# Patient Record
Sex: Male | Born: 1960 | Race: White | Hispanic: No | Marital: Married | Smoking: Never smoker
Health system: Southern US, Community
[De-identification: ages and names within clinical notes are randomized; demographics above are authoritative.]

## PROBLEM LIST (undated history)

## (undated) DIAGNOSIS — E669 Obesity, unspecified: Secondary | ICD-10-CM

## (undated) DIAGNOSIS — T7840XA Allergy, unspecified, initial encounter: Secondary | ICD-10-CM

## (undated) DIAGNOSIS — I10 Essential (primary) hypertension: Secondary | ICD-10-CM

## (undated) DIAGNOSIS — E785 Hyperlipidemia, unspecified: Secondary | ICD-10-CM

## (undated) DIAGNOSIS — Z86010 Personal history of colonic polyps: Secondary | ICD-10-CM

## (undated) DIAGNOSIS — M1990 Unspecified osteoarthritis, unspecified site: Secondary | ICD-10-CM

## (undated) DIAGNOSIS — F419 Anxiety disorder, unspecified: Secondary | ICD-10-CM

## (undated) DIAGNOSIS — D229 Melanocytic nevi, unspecified: Secondary | ICD-10-CM

## (undated) DIAGNOSIS — K219 Gastro-esophageal reflux disease without esophagitis: Secondary | ICD-10-CM

## (undated) DIAGNOSIS — I452 Bifascicular block: Secondary | ICD-10-CM

## (undated) HISTORY — DX: Anxiety disorder, unspecified: F41.9

## (undated) HISTORY — PX: LASIK: SHX215

## (undated) HISTORY — DX: Unspecified osteoarthritis, unspecified site: M19.90

## (undated) HISTORY — DX: Obesity, unspecified: E66.9

## (undated) HISTORY — DX: Gastro-esophageal reflux disease without esophagitis: K21.9

## (undated) HISTORY — PX: LIPOMA EXCISION: SHX5283

## (undated) HISTORY — DX: Personal history of colonic polyps: Z86.010

## (undated) HISTORY — DX: Hyperlipidemia, unspecified: E78.5

## (undated) HISTORY — PX: COLONOSCOPY: SHX174

## (undated) HISTORY — DX: Bifascicular block: I45.2

## (undated) HISTORY — DX: Essential (primary) hypertension: I10

## (undated) HISTORY — DX: Allergy, unspecified, initial encounter: T78.40XA

## (undated) HISTORY — DX: Melanocytic nevi, unspecified: D22.9

---

## 2004-11-10 ENCOUNTER — Ambulatory Visit: Payer: Self-pay | Admitting: Internal Medicine

## 2005-02-09 ENCOUNTER — Ambulatory Visit: Payer: Self-pay | Admitting: Internal Medicine

## 2005-08-24 ENCOUNTER — Ambulatory Visit: Payer: Self-pay | Admitting: Internal Medicine

## 2007-04-21 ENCOUNTER — Telehealth (INDEPENDENT_AMBULATORY_CARE_PROVIDER_SITE_OTHER): Payer: Self-pay | Admitting: *Deleted

## 2007-04-23 ENCOUNTER — Encounter: Payer: Self-pay | Admitting: Internal Medicine

## 2007-04-23 DIAGNOSIS — D239 Other benign neoplasm of skin, unspecified: Secondary | ICD-10-CM

## 2007-04-23 DIAGNOSIS — J309 Allergic rhinitis, unspecified: Secondary | ICD-10-CM

## 2007-04-23 HISTORY — DX: Other benign neoplasm of skin, unspecified: D23.9

## 2009-09-28 ENCOUNTER — Ambulatory Visit: Payer: Self-pay | Admitting: Internal Medicine

## 2009-09-28 DIAGNOSIS — R05 Cough: Secondary | ICD-10-CM

## 2009-09-28 DIAGNOSIS — F988 Other specified behavioral and emotional disorders with onset usually occurring in childhood and adolescence: Secondary | ICD-10-CM | POA: Insufficient documentation

## 2009-09-29 LAB — CONVERTED CEMR LAB
Basophils Absolute: 0 10*3/uL (ref 0.0–0.1)
Basophils Relative: 0.3 % (ref 0.0–3.0)
Calcium: 10.1 mg/dL (ref 8.4–10.5)
Chloride: 106 meq/L (ref 96–112)
Creatinine, Ser: 0.9 mg/dL (ref 0.4–1.5)
Eosinophils Absolute: 0.1 10*3/uL (ref 0.0–0.7)
Lymphs Abs: 1.5 10*3/uL (ref 0.7–4.0)
MCHC: 34.3 g/dL (ref 30.0–36.0)
MCV: 87.8 fL (ref 78.0–100.0)
Platelets: 268 10*3/uL (ref 150.0–400.0)
Potassium: 5.3 meq/L — ABNORMAL HIGH (ref 3.5–5.1)

## 2009-10-05 ENCOUNTER — Telehealth: Payer: Self-pay | Admitting: Internal Medicine

## 2009-10-05 LAB — CONVERTED CEMR LAB: IgE (Immunoglobulin E), Serum: 7.7 intl units/mL (ref 0.0–180.0)

## 2010-05-16 NOTE — Assessment & Plan Note (Signed)
Summary: Primary svc/ new pt eval for cough   Primary Provider/Referring Provider:  Sandrea Hughs, MD  CC:  Acute visit.  Pt last seen here in 2006.  He c/o dry cough x 4 wks.  He also would like to discuss ADHD.  He has had some issues with concentration.  He brought in ADHD test with him today.Marland Kitchen  History of Present Illness: 25 yowm never smoker with h/o chronic intermittent rhinitis not seasonal.  September 28, 2009 cough x 1 month assoc with mild sob day > night, dry, no real increase in sinus symptoms, previously responded to allegra but did not know he could get this otc.  No purulent secretions. Still aerobically active.  Pt denies any significant sore throat, dysphagia, itching, sneezing,  nasal congestion or excess secretions,  fever, chills, sweats, unintended wt loss, pleuritic or exertional cp, hempoptysis, change in activity tolerance  orthopnea pnd or leg swelling. Pt also denies any obvious fluctuation in symptoms with weather or environmental change or other alleviating or aggravating factors.       Current Medications (verified): 1)  Adult Aspirin Ec Low Strength 81 Mg  Tbec (Aspirin) 2)  Multivitamins  Tabs (Multiple Vitamin) .Marland Kitchen.. 1 Once Daily 3)  Ibuprofen 200 Mg Tabs (Ibuprofen) .... 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  The patient denies shortness of breath with activity, productive cough, coughing up blood,  chest pain, irregular heartbeats, acid heartburn, indigestion, loss of appetite, weight change, abdominal pain, difficulty swallowing, sore throat, tooth/dental problems, headaches, nasal congestion/difficulty breathing through nose, sneezing, itching, ear ache, depression, hand/feet swelling, joint stiffness or pain, rash, change in color of mucus, and fever.    Vital Signs:  Patient profile:   50 year old male Height:      70 inches Weight:      191 pounds BMI:     27.50 O2 Sat:      99 % on Room air Temp:     98.8 degrees F oral Pulse rate:   63 / minute BP sitting:   110 / 68  (left arm)  Vitals Entered By: Vernie Murders (September 28, 2009 10:40 AM)  O2 Flow:  Room air  Physical Exam  Additional Exam:  wt 181 > 191 September 28, 2009 HEENT: nl dentition, turbinates, and orophanx. Nl external ear canals without cough reflex NECK :  without JVD/Nodes/TM/ nl carotid upstrokes bilaterally LUNGS: no acc muscle use, clear to A and P bilaterally without cough on insp or exp maneuvers CV:  RRR  no s3 or murmur or increase in P2, no edema  ABD:  soft and nontender with nl excursion in the supine position. No bruits or organomegaly, bowel sounds nl MS:  warm without deformities, calf tenderness, cyanosis or clubbing SKIN: warm and dry without lesions   NEURO:  alert, approp, no deficits      Sodium  142 mEq/L                   135-145   Potassium            [H]  5.3 mEq/L                   3.5-5.1   Chloride                  106 mEq/L                   96-112   Carbon Dioxide            31 mEq/L                    19-32   Glucose              [H]  100 mg/dL                   47-82   BUN                       10 mg/dL                    9-56   Creatinine                0.9 mg/dL                   2.1-3.0   Calcium                   10.1 mg/dL                  8.6-57.8   GFR                       94.75 mL/min                >60  Tests: (2) CBC Platelet w/Diff (CBCD)   White  Cell Count          6.4 K/uL                    4.5-10.5   Red Cell Count            4.78 Mil/uL                 4.22-5.81   Hemoglobin                14.4 g/dL                   46.9-62.9   Hematocrit                42.0 %                      39.0-52.0   MCV                       87.8 fl                     78.0-100.0   MCHC                      34.3 g/dL                   52.8-41.0  RDW                       12.5 %                      11.5-14.6   Platelet Count            268.0 K/uL                  150.0-400.0   Neutrophil %              64.0 %                      43.0-77.0   Lymphocyte %              24.1 %                      12.0-46.0   Monocyte %                10.6 %                      3.0-12.0   Eosinophils%              1.0 %                       0.0-5.0   Basophils %               0.3 %                       0.0-3.0   Neutrophill Absolute      4.1 K/uL                    1.4-7.7   Lymphocyte Absolute       1.5 K/uL                    0.7-4.0   Monocyte Absolute         0.7 K/uL                    0.1-1.0  Eosinophils, Absolute                             0.1 K/uL                    0.0-0.7   Basophils Absolute        0.0 K/uL                    0.0-0.1  CXR  Procedure date:  09/28/2009  Findings:      Comparison: 11/10/2004   Findings: Heart size is upper normal.  Negative for heart failure. Lungs are clear without infiltrate or effusion.  Negative for mass lesion.   IMPRESSION: No acute radiographic abnormality.  Impression & Recommendations:  Problem # 1:  COUGH (ICD-786.2) The most common causes of chronic cough in immunocompetent adults include: upper airway cough syndrome (UACS), previously referred to as postnasal drip syndrome,  caused by variety of rhinosinus conditions; (2) asthma; (3) GERD; (4) chronic bronchitis from cigarette smoking or other inhaled environmental irritants; (5) nonasthmatic eosinophilic bronchitis; and (6) bronchiectasis. These  conditions, singly or in combination, have accounted for up to 94% of the  causes of chronic cough in prospective studies.  this fits best with  Classic Upper airway cough syndrome, so named because it's frequently impossible to sort out how much is  CR/sinusitis with freq throat clearing (which can be related to primary GERD)   vs  causing  secondary extra esophageal GERD from wide swings in gastric pressure that occur with throat clearing, promoting self use of mint and menthol lozenges that reduce the lower esophageal sphincter tone and exacerbate the problem further These are the same pts who not infrequently have failed to tolerate ace inhibitors,  dry powder inhalers or biphosphonates or report having reflux symptoms that don't respond to standard doses of PPI  See instructions for specific recommendations   Problem # 2:  ATTENTION DEFICIT DISORDER, 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 1 once daily 2)  Ibuprofen 200 Mg Tabs (Ibuprofen) .... As directed as needed  Other Orders: T-2 View CXR (71020TC)  Patient Instructions: 1)  See Patient Care Coordinator before leaving for psychiatric referral for possible Adult ADD 2)  Try clairiton otc for cough and if ineffective try [place]rim[place] 3)  Please schedule a follow-up appointment in 6 weeks, sooner if needed    CXR  Procedure date:  09/28/2009  Findings:      Comparison: 11/10/2004   Findings: Heart size is upper normal.  Negative for heart failure. Lungs are clear without infiltrate or effusion.  Negative for mass lesion.   IMPRESSION: No acute radiographic abnormality.

## 2010-05-16 NOTE — Progress Notes (Signed)
Summary: returning call  Phone Note Call from Patient Call back at Work Phone [phone]   Caller: Patient Call For: Chelsey Redondo Summary of Call: Returning Leslie's call. Initial call taken by: Darletta Moll,  October 05, 2009 8:19 AM  Follow-up for Phone Call        pt returned call to leslie----spoke with pt and he is aware of his allergy panel was normal.  pt requested copy mailed to him Randell Loop CMA  October 05, 2009 8:49 AM

## 2010-08-21 ENCOUNTER — Ambulatory Visit (INDEPENDENT_AMBULATORY_CARE_PROVIDER_SITE_OTHER): Payer: BC Managed Care – PPO | Admitting: Adult Health

## 2010-08-21 ENCOUNTER — Encounter: Payer: Self-pay | Admitting: Adult Health

## 2010-08-21 DIAGNOSIS — J209 Acute bronchitis, unspecified: Secondary | ICD-10-CM

## 2010-08-21 MED ORDER — CEFDINIR 300 MG PO CAPS: 300.0000 mg | ORAL_CAPSULE | Freq: Two times a day (BID) | ORAL | Status: AC

## 2010-08-21 MED ORDER — HYDROCODONE-HOMATROPINE 5-1.5 MG/5ML PO SYRP: 5.0000 mL | ORAL_SOLUTION | Freq: Four times a day (QID) | ORAL | Status: AC | PRN

## 2010-08-21 NOTE — Patient Instructions (Addendum)
Omnicef 300mg  Twice daily  For 7 days take with food  Mucinex DM Twice daily   Fluids and rest  Hydromet 1-2 tsp every 4-6 hr As needed  For cough  Please contact office for sooner follow up if symptoms do not improve or worsen or seek emergency care

## 2010-08-21 NOTE — Progress Notes (Signed)
  Subjective:    Patient ID: Louis Lamb, male    DOB: 1960/04/18, 50 y.o.   MRN: [id number]  HPI 08/21/10 Acute OV  Pt presents for work in visit. Complains of prod cough with yellow / brown mucus, increased respirations, lightheadedness x1week. Has drainage down throat. OTC not working. Cough is getting worse. No chest pain or hemoptysis.  Cough is keeping him up at night. Has some nasal stuffiness and ear fullness. No sinus pressure.   Review of Systems Constitutional:   No  weight loss, night sweats,  Fevers, chills, .  HEENT:   No headaches,  Difficulty swallowing,  Tooth/dental problems, or  Sore throat,             +sneezing, itching, ear fullness , nasal congestion, post nasal drip,   CV:  No chest pain,  Orthopnea, PND, swelling in lower extremities, anasarca, dizziness, palpitations, syncope.   GI  No heartburn, indigestion, abdominal pain, nausea, vomiting, diarrhea, change in bowel habits, loss of appetite, bloody stools.   Resp: No shortness of breath with exertion or at rest.  + excess mucus, no productive cough, No wheezing.  No chest wall deformity  Skin: no rash or lesions.  GU: no dysuria, change in color of urine, no urgency or frequency.  No flank pain, no hematuria   MS:  No joint pain or swelling.  No decreased range of motion.  No back pain.  Psych:  No change in mood or affect. No depression or anxiety.  No memory loss.          Objective:   Physical Exam GEN: A/Ox3; pleasant , NAD, well nourished   HEENT:  [place]/AT,  EACs-clear, TMs-wnl, NOSE-clear drainage  THROAT-clear, no lesions, no postnasal drip or exudate noted.   NECK:  Supple w/ fair ROM; no JVD; normal carotid impulses w/o bruits; no thyromegaly or nodules palpated; no lymphadenopathy.  RESP  Coarse BS  P & A; w/o, wheezes/ rales/ or rhonchi.no accessory muscle use, no dullness to percussion  CARD:  RRR, no m/r/g  , no peripheral edema, pulses intact, no cyanosis or clubbing.  GI:    Soft & nt; nml bowel sounds; no organomegaly or masses detected.  Musco: Warm bil, no deformities or joint swelling noted.   Neuro: alert, no focal deficits noted.    Skin: Warm, no lesions or rashes         Assessment & Plan:

## 2010-08-27 DIAGNOSIS — J209 Acute bronchitis, unspecified: Secondary | ICD-10-CM | POA: Insufficient documentation

## 2010-08-27 NOTE — Assessment & Plan Note (Signed)
Omnicef 300mg Twice daily  For 7 days take with food  Mucinex DM Twice daily   Fluids and rest  Hydromet 1-2 tsp every 4-6 hr As needed  For cough  Please contact office for sooner follow up if symptoms do not improve or worsen or seek emergency care   

## 2011-07-20 ENCOUNTER — Encounter: Payer: Self-pay | Admitting: Adult Health

## 2011-07-20 ENCOUNTER — Ambulatory Visit (INDEPENDENT_AMBULATORY_CARE_PROVIDER_SITE_OTHER): Payer: BC Managed Care – PPO | Admitting: Adult Health

## 2011-07-20 ENCOUNTER — Other Ambulatory Visit (INDEPENDENT_AMBULATORY_CARE_PROVIDER_SITE_OTHER): Payer: BC Managed Care – PPO

## 2011-07-20 VITALS — BP 134/86 | HR 67 | Temp 97.6°F | Ht 72.0 in | Wt 192.6 lb

## 2011-07-20 DIAGNOSIS — I10 Essential (primary) hypertension: Secondary | ICD-10-CM

## 2011-07-20 DIAGNOSIS — R030 Elevated blood-pressure reading, without diagnosis of hypertension: Secondary | ICD-10-CM

## 2011-07-20 LAB — BASIC METABOLIC PANEL
BUN: 15 mg/dL (ref 6–23)
CO2: 29 mEq/L (ref 19–32)
Chloride: 101 mEq/L (ref 96–112)
GFR: 84.34 mL/min (ref >60.00)
Glucose, Bld: 108 mg/dL — ABNORMAL HIGH (ref 70–99)
Potassium: 4.3 mEq/L (ref 3.5–5.1)

## 2011-07-20 NOTE — Progress Notes (Signed)
  Subjective:    Patient ID: Louis Lamb, male    DOB: Dec 29, 1960, 51 y.o.   MRN: [id number]  HPI 51 yowm never smoker with h/o chronic intermittent rhinitis  07/20/11 Acute OV  Has noticed elevated blood pressure  readings up to 145/90 on 07-19-11. No associated headache or chest pain. No visual/speech changes. Takes an occasional advil .  No new meds.    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  HEENT:   No headaches,  Difficulty swallowing,  Tooth/dental problems, or  Sore throat,                No sneezing, itching, ear ache, nasal congestion, post nasal drip,   CV:  No chest pain,  Orthopnea, PND, swelling in lower extremities, anasarca, dizziness, palpitations, syncope.   GI  No heartburn, indigestion, abdominal pain, nausea, vomiting, diarrhea, change in bowel habits, loss of appetite, bloody stools.   Resp: No shortness of breath with exertion or at rest.  No excess mucus, no productive cough,  No non-productive cough,  No coughing up of blood.  No change in color of mucus.  No wheezing.  No chest wall deformity  Skin: no rash or lesions.  GU: no dysuria, change in color of urine, no urgency or frequency.  No flank pain, no hematuria   MS:  No joint pain or swelling.  No decreased range of motion.  No back pain.  Psych:  No change in mood or affect. No depression or anxiety.  No memory loss.         Objective:   Physical Exam GEN: A/Ox3; pleasant , NAD, well nourished   HEENT:  [place]/AT,   EACs-clear, TMs-wnl, NOSE-clear, THROAT-clear, no lesions, no postnasal drip or exudate noted.   NECK:  Supple w/ fair ROM; no JVD; normal carotid impulses w/o bruits; no thyromegaly or nodules palpated; no lymphadenopathy.  RESP  Clear  P & A; w/o, wheezes/ rales/ or rhonchi.no accessory muscle use, no dullness to percussion  CARD:  RRR, no m/r/g  , no peripheral edema, pulses intact, no cyanosis or clubbing.  GI:   Soft & nt; nml bowel sounds; no organomegaly or masses detected.  Musco: Warm bil, no deformities or joint swelling noted.   Neuro: alert, no focal deficits noted.    Skin: Warm, no lesions or rashes         Assessment & Plan:

## 2011-07-20 NOTE — Patient Instructions (Signed)
Healthy lifestyle changes that we discussed.  Check blood pressure 3 times weekly , keep log, call if blood pressure >160 . -bring on return office visit.  Low salt diet.  Exercise , weight loss.  I will call with labs work  Avoid Decongestants- sudafed like meds and NSAIDS -advil like meds  Follow up Dr. Sherene Sires  In 4 weeks for physical and As needed  -come fasting

## 2011-07-23 DIAGNOSIS — I10 Essential (primary) hypertension: Secondary | ICD-10-CM | POA: Insufficient documentation

## 2011-07-23 NOTE — Assessment & Plan Note (Addendum)
Check bmet   Plan:  Healthy lifestyle changes that we discussed.  Check blood pressure 3 times weekly , keep log, call if blood pressure >160 . -bring on return office visit.  Low salt diet.  Exercise , weight loss.  I will call with labs work  Avoid Decongestants- sudafed like meds and NSAIDS -advil like meds  Follow up Dr. Sherene Sires  In 4 weeks for physical and As needed  -come fasting

## 2011-07-26 NOTE — Progress Notes (Signed)
Quick Note:  Called, spoke with pt. I informed him labs look ok per TP, and advised he should follow up as planned with Dr. Sherene Sires for cpx. He verbalized understanding of these results and recs and voiced no further questions/concerns at this time. ______

## 2011-08-17 ENCOUNTER — Encounter: Payer: Self-pay | Admitting: Internal Medicine

## 2011-08-17 ENCOUNTER — Other Ambulatory Visit (INDEPENDENT_AMBULATORY_CARE_PROVIDER_SITE_OTHER): Payer: BC Managed Care – PPO

## 2011-08-17 ENCOUNTER — Ambulatory Visit (INDEPENDENT_AMBULATORY_CARE_PROVIDER_SITE_OTHER): Payer: BC Managed Care – PPO | Admitting: Internal Medicine

## 2011-08-17 ENCOUNTER — Ambulatory Visit (INDEPENDENT_AMBULATORY_CARE_PROVIDER_SITE_OTHER)
Admission: RE | Admit: 2011-08-17 | Discharge: 2011-08-17 | Disposition: A | Discharge status: Home / Self Care | Payer: BC Managed Care – PPO | Source: Ambulatory Visit | Attending: Internal Medicine | Admitting: Internal Medicine

## 2011-08-17 VITALS — BP 132/90 | HR 62 | Temp 99.5°F | Ht 72.0 in | Wt 189.0 lb

## 2011-08-17 DIAGNOSIS — Z0000 Encounter for general adult medical examination without abnormal findings: Secondary | ICD-10-CM

## 2011-08-17 DIAGNOSIS — E785 Hyperlipidemia, unspecified: Secondary | ICD-10-CM

## 2011-08-17 DIAGNOSIS — R030 Elevated blood-pressure reading, without diagnosis of hypertension: Secondary | ICD-10-CM

## 2011-08-17 DIAGNOSIS — J309 Allergic rhinitis, unspecified: Secondary | ICD-10-CM

## 2011-08-17 DIAGNOSIS — I10 Essential (primary) hypertension: Secondary | ICD-10-CM

## 2011-08-17 LAB — HEPATIC FUNCTION PANEL
ALT: 27 U/L (ref 0–53)
AST: 28 U/L (ref 0–37)
Alkaline Phosphatase: 65 U/L (ref 39–117)
Bilirubin, Direct: 0 mg/dL (ref 0.0–0.3)
Total Protein: 7.5 g/dL (ref 6.0–8.3)

## 2011-08-17 LAB — BASIC METABOLIC PANEL
BUN: 15 mg/dL (ref 6–23)
Creatinine, Ser: 0.9 mg/dL (ref 0.4–1.5)
GFR: 99.01 mL/min (ref >60.00)
Glucose, Bld: 88 mg/dL (ref 70–99)

## 2011-08-17 LAB — LIPID PANEL
Total CHOL/HDL Ratio: 4
Triglycerides: 253 mg/dL — ABNORMAL HIGH (ref 0.0–149.0)

## 2011-08-17 LAB — URINALYSIS
Bilirubin Urine: NEGATIVE
Ketones, ur: NEGATIVE
Leukocytes, UA: NEGATIVE
Nitrite: NEGATIVE
Specific Gravity, Urine: 1.01 (ref 1.000–1.030)
Urobilinogen, UA: 0.2 (ref 0.0–1.0)
pH: 7 (ref 5.0–8.0)

## 2011-08-17 LAB — CBC WITH DIFFERENTIAL/PLATELET
Basophils Relative: 0.6 % (ref 0.0–3.0)
Eosinophils Relative: 1.4 % (ref 0.0–5.0)
Lymphocytes Relative: 24.9 % (ref 12.0–46.0)
MCV: 86.4 fl (ref 78.0–100.0)
Monocytes Relative: 10.1 % (ref 3.0–12.0)
Neutrophils Relative %: 63 % (ref 43.0–77.0)
RBC: 5.18 Mil/uL (ref 4.22–5.81)
WBC: 6.4 10*3/uL (ref 4.5–10.5)

## 2011-08-17 LAB — TSH: TSH: 1.57 u[IU]/mL (ref 0.35–5.50)

## 2011-08-17 NOTE — Patient Instructions (Signed)
Weight control is simply a matter of calorie balance which needs to be tilted in your favor by eating less and exercising more.  To get the most out of exercise, you need to be continuously aware that you are short of breath, but never out of breath, for 30 minutes (minimum of 3 x weeks) . As you improve, it will actually be easier for you to do the same amount of exercise  in  30 minutes so always push to the level where you are short of breath.  If this does not result in gradual weight reduction then I strongly recommend you see a nutritionist with a food diary x 2 weeks so that we can work out a negative calorie balance which is universally effective in steady weight loss programs.  Your goal is 135/85 with colonoscopy due next year.  Please remember to go to the x-ray department downstairs for your tests - we will call you with the results when they are available.

## 2011-08-17 NOTE — Progress Notes (Signed)
  Subjective:    Patient ID: Louis Lamb, male    DOB: 21-Feb-1961, 51 y.o.   MRN: [id number]  HPI 64 yowm never smoker with h/o chronic intermittent rhinitis and no knowledge of fm hx as adopted followed in pulmonary clinic for primary care.  07/20/11 Acute OV /Willys Salvino Has noticed elevated blood pressure  readings up to 145/90 on 07-19-11. No associated headache or chest pain. No visual/speech changes. Takes an occasional advil .  No new meds.   08/17/11 CPX cc self monitor bp on high side but none above 140/90, no tia or claudication or cp or sob/ ha.  ROS  At present neg for  any significant sore throat, dysphagia, dental problems, itching, sneezing,  nasal congestion or excess/ purulent secretions, ear ache,   fever, chills, sweats, unintended wt loss, pleuritic or exertional cp, hemoptysis, palpitations, orthopnea pnd or leg swelling.  Also denies presyncope, palpitations, heartburn, abdominal pain, anorexia, nausea, vomiting, diarrhea  or change in bowel or urinary habits, change in stools or urine, dysuria,hematuria,  rash, arthralgias, visual complaints, headache, numbness weakness or ataxia or problems with walking or coordination. No noted change in mood/affect or memory.                   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   NECK:  Supple w/  fair ROM; no JVD; normal carotid impulses w/o bruits; no thyromegaly or nodules palpated; no lymphadenopathy.  RESP  Clear  P & A; w/o, wheezes/ rales/ or rhonchi.no accessory muscle use, no dullness to percussion  CARD:  RRR, no m/r/g  , no peripheral edema, pulses intact, no cyanosis or clubbing.  GI:   Soft & nt; nml bowel sounds; no organomegaly or masses detected.  Musco: Warm bil, no deformities or joint swelling noted.   Neuro: alert, no focal deficits noted.    Skin: Warm, no lesions or rashes  GU Circ, no IH, no nodules  Prostate nl, no nodules, stool g neg  Neuro  Mood and affect and sensorium nl, no motor def or path reflexes, nl cerebellar testing.    CXR  08/17/2011 :  No active disease. No significant change.        Assessment & Plan:

## 2011-08-18 NOTE — Assessment & Plan Note (Signed)
Adequate control on present rx, reviewed  

## 2011-08-18 NOTE — Assessment & Plan Note (Signed)
Borderline and prob can be controlled with diet and ex, discussed.

## 2012-09-04 ENCOUNTER — Ambulatory Visit (INDEPENDENT_AMBULATORY_CARE_PROVIDER_SITE_OTHER)
Admission: RE | Admit: 2012-09-04 | Discharge: 2012-09-04 | Disposition: A | Discharge status: Home / Self Care | Payer: Managed Care, Other (non HMO) | Source: Ambulatory Visit | Attending: Internal Medicine | Admitting: Internal Medicine

## 2012-09-04 ENCOUNTER — Encounter: Payer: Self-pay | Admitting: Internal Medicine

## 2012-09-04 ENCOUNTER — Other Ambulatory Visit (INDEPENDENT_AMBULATORY_CARE_PROVIDER_SITE_OTHER): Payer: Managed Care, Other (non HMO)

## 2012-09-04 ENCOUNTER — Ambulatory Visit (INDEPENDENT_AMBULATORY_CARE_PROVIDER_SITE_OTHER): Payer: Managed Care, Other (non HMO) | Admitting: Internal Medicine

## 2012-09-04 VITALS — BP 120/82 | HR 78 | Temp 98.8°F | Ht 72.0 in | Wt 191.0 lb

## 2012-09-04 DIAGNOSIS — F988 Other specified behavioral and emotional disorders with onset usually occurring in childhood and adolescence: Secondary | ICD-10-CM

## 2012-09-04 DIAGNOSIS — D239 Other benign neoplasm of skin, unspecified: Secondary | ICD-10-CM

## 2012-09-04 DIAGNOSIS — Z0000 Encounter for general adult medical examination without abnormal findings: Secondary | ICD-10-CM | POA: Insufficient documentation

## 2012-09-04 DIAGNOSIS — J309 Allergic rhinitis, unspecified: Secondary | ICD-10-CM

## 2012-09-04 DIAGNOSIS — E785 Hyperlipidemia, unspecified: Secondary | ICD-10-CM

## 2012-09-04 DIAGNOSIS — I10 Essential (primary) hypertension: Secondary | ICD-10-CM

## 2012-09-04 DIAGNOSIS — Z125 Encounter for screening for malignant neoplasm of prostate: Secondary | ICD-10-CM | POA: Insufficient documentation

## 2012-09-04 LAB — CBC WITH DIFFERENTIAL/PLATELET
Basophils Absolute: 0 10*3/uL (ref 0.0–0.1)
Basophils Relative: 0.5 % (ref 0.0–3.0)
Hemoglobin: 14.5 g/dL (ref 13.0–17.0)
Lymphocytes Relative: 15.3 % (ref 12.0–46.0)
Monocytes Relative: 8.1 % (ref 3.0–12.0)
Neutro Abs: 5.9 10*3/uL (ref 1.4–7.7)
RBC: 4.93 Mil/uL (ref 4.22–5.81)

## 2012-09-04 LAB — URINALYSIS
Bilirubin Urine: NEGATIVE
Ketones, ur: NEGATIVE
Specific Gravity, Urine: <=1.005 (ref 1.000–1.030)
Urine Glucose: NEGATIVE
pH: 6.5 (ref 5.0–8.0)

## 2012-09-04 LAB — LIPID PANEL
Cholesterol: 223 mg/dL — ABNORMAL HIGH (ref 0–200)
Triglycerides: 142 mg/dL (ref 0.0–149.0)
VLDL: 28.4 mg/dL (ref 0.0–40.0)

## 2012-09-04 LAB — BASIC METABOLIC PANEL
Calcium: 9.9 mg/dL (ref 8.4–10.5)
GFR: 93.6 mL/min (ref >60.00)
Sodium: 136 mEq/L (ref 135–145)

## 2012-09-04 LAB — HEPATIC FUNCTION PANEL
AST: 27 U/L (ref 0–37)
Alkaline Phosphatase: 60 U/L (ref 39–117)
Bilirubin, Direct: 0.1 mg/dL (ref 0.0–0.3)

## 2012-09-04 LAB — PSA: PSA: 0.63 ng/mL (ref 0.10–4.00)

## 2012-09-04 LAB — LDL CHOLESTEROL, DIRECT: Direct LDL: 138.1 mg/dL

## 2012-09-04 LAB — TSH: TSH: 1.33 u[IU]/mL (ref 0.35–5.50)

## 2012-09-04 NOTE — Patient Instructions (Addendum)
Please see patient coordinator before you leave today  to schedule colonoscopy   Please remember to go to the lab and x-ray department downstairs for your tests - we will call you with the results when they are available.

## 2012-09-04 NOTE — Progress Notes (Signed)
  Subjective:    Patient ID: Louis Lamb, male    DOB: 27-Nov-1960, 52 y.o.   MRN: [id number]  HPI 28 yowm never smoker with h/o chronic intermittent rhinitis and no knowledge of fm hx as adopted followed in pulmonary clinic for primary care.  07/20/11 Acute OV /[name] Has noticed elevated blood pressure  readings up to 145/90 on 07-19-11. No associated headache or chest pain. No visual/speech changes. Takes an occasional advil .  No new meds.   08/17/11 CPX cc self monitor bp on high side but none above 140/90, no tia or claudication or cp or sob/ ha. Weight control Your goal is 135/85 with colonoscopy due next year.  09/04/2012  Cpx  ROS  At present neg for  any significant sore throat, dysphagia, dental problems, itching, sneezing,  nasal congestion or excess/ purulent secretions, ear ache,   fever, chills, sweats, unintended wt loss, pleuritic or exertional cp, hemoptysis, palpitations, orthopnea pnd or leg swelling.  Also denies presyncope, palpitations, heartburn, abdominal pain, anorexia, nausea, vomiting, diarrhea  or change in bowel or urinary habits, change in stools or urine, dysuria,hematuria,  rash, arthralgias, visual complaints, headache, numbness weakness or ataxia or problems with walking or coordination. No noted change in mood/affect or memory.                   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   NECK:  Supple w/ fair ROM; no JVD; normal carotid impulses w/o bruits; no thyromegaly or nodules palpated; no lymphadenopathy.  RESP  Clear  P & A; w/o, wheezes/ rales/ or rhonchi.no accessory muscle use, no dullness to percussion  CARD:  RRR, no m/r/g  , no peripheral edema, pulses intact, no cyanosis or clubbing.  GI:   Soft & nt; nml bowel sounds; no organomegaly or masses detected.  Musco: Warm bil, no deformities or joint swelling noted.   Neuro: alert, no focal deficits noted.    Skin: Warm, no lesions or rashes > pencil eraser nevus L thigh, smooth borders, homogeneous (dime size)  GU Circ, no IH, no nodules  Prostate nl, no nodules, stool g neg  Neuro  Mood and affect and sensorium nl, no motor def or path reflexes, nl cerebellar testing.    CXR  09/04/2012 :  No acute or active cardiopulmonary or pleural abnormalities are evident.          Assessment & Plan:

## 2012-09-05 ENCOUNTER — Telehealth: Payer: Self-pay | Admitting: Internal Medicine

## 2012-09-05 NOTE — Telephone Encounter (Signed)
Notes Recorded by Nyoka Cowden, MD on 09/04/2012 at 5:16 PM Call patient : Study is unremarkable, no change in recs  -----  lmomtcb x1 for [facility]

## 2012-09-05 NOTE — Assessment & Plan Note (Signed)
No change x years per pt

## 2012-09-05 NOTE — Assessment & Plan Note (Addendum)
-   Allergy profile September 28, 2009 >>>  IgE 7.7. No allergens identified   Adequate control on present rx, reviewed   prns otc

## 2012-09-05 NOTE — Assessment & Plan Note (Signed)
Adequate control on present rx, reviewed  

## 2012-09-05 NOTE — Assessment & Plan Note (Signed)
Adequate control on present rx, reviewed rx per pysch

## 2012-09-05 NOTE — Assessment & Plan Note (Signed)
-   Target LDL < 130 as do not know fm hx and borderline hbp  Lab Results  Component Value Date   CHOL 223* 09/04/2012   HDL 58.20 09/04/2012   LDLDIRECT 138.1 09/04/2012   TRIG 142.0 09/04/2012   CHOLHDL 4 09/04/2012     Not at target > diet and ex for now

## 2012-09-05 NOTE — Assessment & Plan Note (Signed)
Up to date x colonoscopy age 52, fm hx unknown > schedule

## 2012-09-05 NOTE — Telephone Encounter (Signed)
I spoke with patient about results and he verbalized understanding and had no questions 

## 2012-09-05 NOTE — Telephone Encounter (Signed)
Pt returned call.  He asked that if we can to leave a detailed message on his VM if he doesn't answer. Leanora Ivanoff

## 2012-09-19 ENCOUNTER — Encounter: Payer: Self-pay | Admitting: Internal Medicine

## 2012-09-19 ENCOUNTER — Ambulatory Visit (AMBULATORY_SURGERY_CENTER): Payer: Managed Care, Other (non HMO) | Admitting: *Deleted

## 2012-09-19 VITALS — Ht 72.0 in | Wt 188.0 lb

## 2012-09-19 DIAGNOSIS — Z1211 Encounter for screening for malignant neoplasm of colon: Secondary | ICD-10-CM

## 2012-09-19 MED ORDER — NA SULFATE-K SULFATE-MG SULF 17.5-3.13-1.6 GM/177ML PO SOLN: ORAL | Status: DC

## 2012-10-10 ENCOUNTER — Ambulatory Visit (AMBULATORY_SURGERY_CENTER): Payer: Managed Care, Other (non HMO) | Admitting: Internal Medicine

## 2012-10-10 ENCOUNTER — Encounter: Payer: Self-pay | Admitting: Internal Medicine

## 2012-10-10 VITALS — BP 114/55 | HR 57 | Temp 97.6°F | Resp 21 | Ht 72.0 in | Wt 188.0 lb

## 2012-10-10 DIAGNOSIS — Z1211 Encounter for screening for malignant neoplasm of colon: Secondary | ICD-10-CM

## 2012-10-10 DIAGNOSIS — D126 Benign neoplasm of colon, unspecified: Secondary | ICD-10-CM

## 2012-10-10 MED ORDER — SODIUM CHLORIDE 0.9 % IV SOLN: 500.0000 mL | INTRAVENOUS | Status: DC

## 2012-10-10 NOTE — Progress Notes (Signed)
Report to pacu rn, vss, bbs=clear 

## 2012-10-10 NOTE — Patient Instructions (Addendum)
YOU HAD AN ENDOSCOPIC PROCEDURE TODAY AT THE [facility]: Refer to the procedure report that was given to you for any specific questions about what was found during the examination.  If the procedure report does not answer your questions, please call your gastroenterologist to clarify.  If you requested that your care partner not be given the details of your procedure findings, then the procedure report has been included in a sealed envelope for you to review at your convenience later.  YOU SHOULD EXPECT: Some feelings of bloating in the abdomen. Passage of more gas than usual.  Walking can help get rid of the air that was put into your GI tract during the procedure and reduce the bloating. If you had a lower endoscopy (such as a colonoscopy or flexible sigmoidoscopy) you may notice spotting of blood in your stool or on the toilet paper. If you underwent a bowel prep for your procedure, then you may not have a normal bowel movement for a few days.  DIET: Your first meal following the procedure should be a light meal and then it is ok to progress to your normal diet.  A half-sandwich or bowl of soup is an example of a good first meal.  Heavy or fried foods are harder to digest and may make you feel nauseous or bloated.  Likewise meals heavy in dairy and vegetables can cause extra gas to form and this can also increase the bloating.  Drink plenty of fluids but you should avoid alcoholic beverages for 24 hours.  ACTIVITY: Your care partner should take you home directly after the procedure.  You should plan to take it easy, moving slowly for the rest of the day.  You can resume normal activity the day after the procedure however you should NOT DRIVE or use heavy machinery for 24 hours (because of the sedation medicines used during the test).    SYMPTOMS TO REPORT IMMEDIATELY: A gastroenterologist can be reached at any hour.  During normal business hours, 8:30 AM to 5:00 PM Monday through Friday,  call [phone].  After hours and on weekends, please call the GI answering service at [phone] who will take a message and have the physician on call contact you.   Following lower endoscopy (colonoscopy or flexible sigmoidoscopy):  Excessive amounts of blood in the stool  Significant tenderness or worsening of abdominal pains  Swelling of the abdomen that is new, acute  Fever of 100F or higher FOLLOW UP: If any biopsies were taken you will be contacted by phone or by letter within the next 1-3 weeks.  Call your gastroenterologist if you have not heard about the biopsies in 3 weeks.  Our staff will call the home number listed on your records the next business day following your procedure to check on you and address any questions or concerns that you may have at that time regarding the information given to you following your procedure. This is a courtesy call and so if there is no answer at the home number and we have not heard from you through the emergency physician on call, we will assume that you have returned to your regular daily activities without incident.  SIGNATURES/CONFIDENTIALITY: You and/or your care partner have signed paperwork which will be entered into your electronic medical record.  These signatures attest to the fact that that the information above on your After Visit Summary has been reviewed and is understood.  Full responsibility of the confidentiality of this discharge   information lies with you and/or your care-partner.  Polyp information given. 

## 2012-10-10 NOTE — Progress Notes (Signed)
Patient did not experience any of the following events: a burn prior to discharge; a fall within the facility; wrong site/side/patient/procedure/implant event; or a hospital transfer or hospital admission upon discharge from the facility. (G8907) Patient did not have preoperative order for IV antibiotic SSI prophylaxis. ([id number])  

## 2012-10-10 NOTE — Op Note (Signed)
[facility] [address] [place], [address]   COLONOSCOPY PROCEDURE REPORT  PATIENT: Louis Lamb, Louis Lamb  MR#: [id number] BIRTHDATE: 1961-01-18 , 52  yrs. old GENDER: Male ENDOSCOPIST: Iva Boop, MD, [facility] REFERRED XB:[id number] Denice Paradise, M.D. PROCEDURE DATE:  10/10/2012 PROCEDURE:   Colonoscopy with snare polypectomy ASA CLASS:   Class II INDICATIONS:average risk screening and first colonoscopy. MEDICATIONS: propofol (Diprivan) 300mg  IV, MAC sedation, administered by CRNA, and These medications were titrated to patient response per physician's verbal order  DESCRIPTION OF PROCEDURE:   After the risks benefits and alternatives of the procedure were thoroughly explained, informed consent was obtained.  A digital rectal exam revealed no abnormalities of the rectum, A digital rectal exam revealed the prostate was not enlarged, and A digital rectal exam revealed no prostatic nodules.   The LB [id number]  endoscope was introduced through the anus and advanced to the cecum, which was identified by both the appendix and ileocecal valve. No adverse events experienced.   The quality of the prep was excellent using Suprep  The instrument was then slowly withdrawn as the colon was fully examined.    COLON FINDINGS: Three diminutive sessile polyps were found at the cecum, in the ascending colon, and transverse colon.  A polypectomy was performed with a cold snare.  The resection was complete and the polyp tissue was completely retrieved.   The colon mucosa was otherwise normal.   A right colon retroflexion was performed. Retroflexed views revealed no abnormalities. The time to cecum=3 minutes 53 seconds.  Withdrawal time=14 minutes 24 seconds.  The scope was withdrawn and the procedure completed. COMPLICATIONS: There were no complications.  ENDOSCOPIC IMPRESSION: 1.   Three diminutive sessile polyps were found at the cecum, in the ascending colon, and transverse  colon; polypectomy was performed with a cold snare 2.   The colon mucosa was otherwise normal, excellent prep - first colonoscopy  RECOMMENDATIONS: Timing of repeat colonoscopy will be determined by pathology findings.   eSigned:  Iva Boop, MD, [facility] 10/10/2012 2:33 PM   cc: Nyoka Cowden, MD and The Patient

## 2012-10-10 NOTE — Progress Notes (Signed)
Called to room to assist during endoscopic procedure.  Patient ID and intended procedure confirmed with present staff. Received instructions for my participation in the procedure from the performing physician.  

## 2012-10-13 ENCOUNTER — Encounter: Payer: Self-pay | Admitting: Internal Medicine

## 2012-10-13 ENCOUNTER — Telehealth: Payer: Self-pay | Admitting: *Deleted

## 2012-10-13 ENCOUNTER — Telehealth: Payer: Self-pay | Admitting: Internal Medicine

## 2012-10-13 NOTE — Telephone Encounter (Signed)
Left message on number givem Friday that identifies pt by name to return call if questions, concerns, or problems. ewm

## 2012-10-13 NOTE — Telephone Encounter (Signed)
Called and spoke with pt and he stated that for his job he needs the following information from his last ov with MW.    BP, weight, height and BMI.  This information has been faxed to the pt at fax # [phone].  Pt is aware that this will be faxed to him. Nothing further is needed.

## 2012-10-20 ENCOUNTER — Encounter: Payer: Self-pay | Admitting: Internal Medicine

## 2012-10-20 DIAGNOSIS — Z860100 Personal history of colon polyps, unspecified: Secondary | ICD-10-CM | POA: Insufficient documentation

## 2012-10-20 HISTORY — DX: Personal history of colonic polyps: Z86.010

## 2012-10-20 HISTORY — DX: Personal history of colon polyps, unspecified: Z86.0100

## 2012-10-20 NOTE — Progress Notes (Signed)
Quick Note:  2 sessile serrated adenomas and 1 tubular adenoma Repeat colonoscopy about 10/2015  ______

## 2012-10-22 ENCOUNTER — Encounter: Payer: Self-pay | Admitting: Internal Medicine

## 2013-02-19 ENCOUNTER — Other Ambulatory Visit: Payer: Self-pay

## 2013-09-17 ENCOUNTER — Telehealth: Payer: Self-pay | Admitting: Internal Medicine

## 2013-09-17 NOTE — Telephone Encounter (Signed)
Pt called back. Made him aware the only record we have is of flu vaccine and PNA vaccine. He was sent to talk with medical records to see if they can pull any records for him. Nothing further needed

## 2013-09-17 NOTE — Telephone Encounter (Signed)
Called pt and LMTCB on named VM for [facility]

## 2013-09-17 NOTE — Telephone Encounter (Signed)
ATC line rang several times and no VM WCB 

## 2014-01-12 ENCOUNTER — Telehealth: Payer: Self-pay | Admitting: Internal Medicine

## 2014-01-12 DIAGNOSIS — D171 Benign lipomatous neoplasm of skin and subcutaneous tissue of trunk: Secondary | ICD-10-CM

## 2014-01-12 NOTE — Telephone Encounter (Signed)
Fine to refer to [facility]

## 2014-01-12 NOTE — Telephone Encounter (Signed)
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  MW please advise. thanks

## 2014-01-13 NOTE — Telephone Encounter (Signed)
LMOM for pt that order for [facility] referral has been placed and [facility] will calling him with appt.

## 2015-02-23 ENCOUNTER — Other Ambulatory Visit: Payer: Self-pay | Admitting: Surgery

## 2015-02-23 ENCOUNTER — Encounter: Payer: Self-pay | Admitting: Surgery

## 2015-02-23 NOTE — H&P (Signed)
Louis Lamb 02/23/2015 4:59 PM Location: [facility] Patient #: [id number] DOB: 1960-10-09 Married / Language: English / Race: White Male   History of Present Illness Louis Hector MD; 02/23/2015 5:09 PM) The patient is a 54 year old male who presents with a soft tissue mass. The patient is referred by a primary care provider (Dr Louis Lamb (Pulmonologist functioning as his PCP)). Concern for persistent LEFT posterior back mass.   Pleasant active golfer. Felt a lump on his back a few years ago. Side to have it evaluated. I saw him in 2015. I offered observation versus removal. Past treatment has included expectant management. Symptoms include single mass, while symptoms do not include multiple masses, pain, paresthesia, pressure, tenderness or impaired movement. The mass is characterized as mobile and stable in size. Onset was sudden. There is no known event that preceded symptom onset. The patient describes this as unchanged. Associated symptoms do not include fever, malaise or polyarthralgia. Current treatment includes expectant management. By report there is good compliance with treatment. Pertinent medical history does not include previous similar mass, previous injection site nearby, collagen vascular disease, inflammatory bowel disease, gout, malignancy, rheumatoid arthritis, sarcoidosis, skin cancer, atypical mycobacterial infection or fungal infection. Risk factors do not include repetitive trauma, ultraviolet light exposure, malignancy, rheumatoid arthritis, rheumatic fever, gout, vasculitis or foreign travel. Pertinent family history does not include rheumatoid arthritis or tuberculosis.  Patient notes that the lump on the back is still there. He wonders if it may be on a little larger. More bothersome. However is never gone away. He wishes to reconsider having it removed.   Problem List/Past Medical Louis Hector, MD; 02/23/2015 5:08 PM) RIGHT FLANK MASS  (R19.00)  Other Problems Louis Hector, MD; 02/23/2015 5:08 PM) Gastroesophageal Reflux Disease Hemorrhoids  Past Surgical History Louis Hector, MD; 02/23/2015 5:08 PM) Colon Polyp Removal - Colonoscopy  Diagnostic Studies History Louis Hector, MD; 02/23/2015 5:08 PM) Colonoscopy 1-5 years ago  Allergies Louis Hector, MD; 02/23/2015 5:08 PM) No Known Drug Allergies10/12/2013  Medication History Louis Hector, MD; 02/23/2015 5:08 PM) Vyvanse (30MG  Capsule, 1 Oral daily) Active. Duexis (800-26.6MG  Tablet, Oral as needed) Active. Aspirin (81MG  Tablet, 1 (one) Oral) Active.  Social History Louis Hector, MD; 02/23/2015 5:08 PM) Alcohol use Occasional alcohol use. Caffeine use Coffee, Tea. No drug use Tobacco use Never smoker.  Family History Louis Hector, MD; 02/23/2015 5:08 PM) Family history unknown First Degree Relatives    Review of Systems Louis Hector, MD; 02/23/2015 5:8 PM) General Not Present- Appetite Loss, Chills, Fatigue, Fever, Night Sweats, Weight Gain and Weight Loss. Skin Not Present- Change in Wart/Mole, Dryness, Hives, Jaundice, New Lesions, Non-Healing Wounds, Rash and Ulcer. HEENT Not Present- Earache, Hearing Loss, Hoarseness, Nose Bleed, Oral Ulcers, Ringing in the Ears, Seasonal Allergies, Sinus Pain, Sore Throat, Visual Disturbances, Wears glasses/contact lenses and Yellow Eyes. Respiratory Present- Snoring. Not Present- Bloody sputum, Chronic Cough, Difficulty Breathing and Wheezing. Breast Not Present- Breast Mass, Breast Pain, Nipple Discharge and Skin Changes. Cardiovascular Not Present- Chest Pain, Difficulty Breathing Lying Down, Leg Cramps, Palpitations, Rapid Heart Rate, Shortness of Breath and Swelling of Extremities. Gastrointestinal Present- Hemorrhoids. Not Present- Abdominal Pain, Bloating, Bloody Stool, Change in Bowel Habits, Chronic diarrhea, Constipation, Difficulty Swallowing, Excessive gas, Gets full quickly at  meals, Indigestion, Nausea, Rectal Pain and Vomiting. Musculoskeletal Not Present- Back Pain, Joint Pain, Joint Stiffness, Muscle Pain, Muscle Weakness and Swelling of Extremities. Neurological Not Present- Decreased Memory, Fainting, Headaches,  Numbness, Seizures, Tingling, Tremor, Trouble walking and Weakness. Psychiatric Not Present- Anxiety, Bipolar, Change in Sleep Pattern, Depression, Fearful and Frequent crying. Endocrine Not Present- Cold Intolerance, Excessive Hunger, Hair Changes, Heat Intolerance, Hot flashes and New Diabetes. Hematology Not Present- Easy Bruising, Excessive bleeding, Gland problems, HIV and Persistent Infections.   Physical Exam Louis Hector MD; 02/23/2015 5:11 PM) General Mental Status-Alert. General Appearance-Not in acute distress, Not Sickly. Orientation-Oriented X3. Hydration-Well hydrated. Voice-Normal.  Integumentary Global Assessment Normal Exam - Axillae: non-tender, no inflammation or ulceration, no drainage. and Distribution of scalp and body hair is normal. General Characteristics Temperature - normal warmth is noted. Note: Known abnormal lumps in lesions.  However just medial to the tip of his LEFT scapula in his LEFT upper paraspinal back, there is a 4 x 4 centimeter deep but partially mobile mass overlying the erector spinae. Mostly soft and ellipsoid. Perhaps slightly larger and more sensitive than last year   Head and Neck Head-normocephalic, atraumatic with no lesions or palpable masses. Face Global Assessment - atraumatic, no absence of expression. Neck Global Assessment - no abnormal movements, no bruit auscultated on the right, no bruit auscultated on the left, no decreased range of motion, non-tender. Trachea-midline. Thyroid Gland Characteristics - non-tender.  Eye Eyeball - Left-Extraocular movements intact, No Nystagmus. Eyeball - Right-Extraocular movements intact, No Nystagmus. Cornea - Left-No  Hazy. Cornea - Right-No Hazy. Sclera/Conjunctiva - Left-No scleral icterus, No Discharge. Sclera/Conjunctiva - Right-No scleral icterus, No Discharge. Pupil - Left-Direct reaction to light normal. Pupil - Right-Direct reaction to light normal.  ENMT Ears Pinna - Left - no drainage observed, no generalized tenderness observed. Right - no drainage observed, no generalized tenderness observed. Nose and Sinuses Nose - no destructive lesion observed. Nares - Left - quiet respiration. Right - quiet respiration. Mouth and Throat Lips - Upper Lip - no fissures observed, no pallor noted. Lower Lip - no fissures observed, no pallor noted. Nasopharynx - no discharge present. Oral Cavity/Oropharynx - Tongue - no dryness observed. Oral Mucosa - no cyanosis observed. Hypopharynx - no evidence of airway distress observed.  Chest and Lung Exam Inspection Movements - Normal and Symmetrical. Accessory muscles - No use of accessory muscles in breathing. Palpation Normal exam - Non-tender. Auscultation Breath sounds - Normal and Clear.  Cardiovascular Auscultation Rhythm - Regular. Murmurs & Other Heart Sounds - Normal exam - No Murmurs and No Systolic Clicks.  Abdomen Inspection Normal Exam - No Visible peristalsis and No Abnormal pulsations. Umbilicus - No Bleeding, No Urine drainage. Palpation/Percussion Normal exam - Soft, Non Tender, No Rebound tenderness, No Rigidity (guarding) and No Cutaneous hyperesthesia.  Peripheral Vascular Upper Extremity Inspection - Left - No Cyanotic nailbeds, Not Ischemic. Right - No Cyanotic nailbeds, Not Ischemic.  Neurologic Neurologic evaluation reveals -normal attention span and ability to concentrate, able to name objects and repeat phrases. Appropriate fund of knowledge , normal sensation and normal coordination. Mental Status Affect - not angry, not paranoid. Cranial Nerves-Normal Bilaterally. Gait-Normal.  Neuropsychiatric Mental  status exam performed with findings of-able to articulate well with normal speech/language, rate, volume and coherence, thought content normal with ability to perform basic computations and apply abstract reasoning and no evidence of hallucinations, delusions, obsessions or homicidal/suicidal ideation.  Musculoskeletal Global Assessment Spine, Ribs and Pelvis - no instability, subluxation or laxity. Right Upper Extremity - no instability, subluxation or laxity.  Lymphatic Head & Neck General Head & Neck Lymphatics: Bilateral - Description - No Localized lymphadenopathy. Axillary General Axillary Region: Bilateral -  Description - No Localized lymphadenopathy. Femoral & Inguinal Generalized Femoral & Inguinal Lymphatics: Left: Right - Description - No Localized lymphadenopathy. Description - No Localized lymphadenopathy.    Assessment & Plan Louis Hector MD; 02/23/2015 5:16 PM) MASS OF SUBCUTANEOUS TISSUE OF BACK (R22.2) Impression: Persistent mass in the deep subcutaneous tissues of his LEFT upper back between back muscles & scapula.  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 However it is becoming more bothersome. Certainly it has not resolved in the year since I saw him. He wishes to have it removed. I think that is reasonable.  Given the deep location again noted I would prefer to have at least some sedation. Hopefully drain not very likely but also a possibility. He had concerns about one it was okay to return to work and when he can play golf again. I cautioned against him overdoing it too soon. Hopefully will come out easily & his recovery will be fast. However sometimes these are more stuck to the back and he needs a few weeks to get back to more intense activity. Current Plans You are being scheduled for surgery - Our schedulers will call you.  You should hear from our office's scheduling department within 5 working days about the location, date, and time of surgery. We try to make  accommodations for patient's preferences in scheduling surgery, but sometimes the OR schedule or the surgeon's schedule prevents [place] from making those accommodations.  If you have not heard from our office [phone]) in 5 working days, call the office and ask for your surgeon's nurse.  If you have other questions about your diagnosis, plan, or surgery, call the office and ask for your surgeon's nurse.  The pathophysiology of skin & subcutaneous masses was discussed. Natural history risks without surgery were discussed. I recommended surgery to remove the mass. I explained the technique of removal with use of local anesthesia & possible need for more aggressive sedation/anesthesia for patient comfort.  Risks such as bleeding, infection, wound breakdown, heart attack, death, and other risks were discussed. I noted a good likelihood this will help address the problem. Possibility that this will not correct all symptoms was explained. Possibility of regrowth/recurrence of the mass was discussed. We will work to minimize complications. Questions were answered. The patient expresses understanding & wishes to proceed with surgery.  Pt Education - [facility] - General recommendations Pt Education - [facility] General Post-op HCI Pt Education - [facility] Pain Control (Laurynn Mccorvey)   Signed by Louis Hector, MD (02/23/2015 5:16 PM)  Louis Lamb, M.D., F.A.C.S. Gastrointestinal and Minimally Invasive Surgery [facility] [address] [place], [place] [phone] [address] / Paging

## 2015-03-21 ENCOUNTER — Other Ambulatory Visit: Payer: Self-pay | Admitting: Surgery

## 2015-03-26 ENCOUNTER — Telehealth: Payer: Self-pay | Admitting: General Surgery

## 2015-03-26 NOTE — Telephone Encounter (Signed)
Pt had a back mass removed on his back.  Drain was left in wound.  Supposed to get it out next week.  Recommend he keep the area covered and change dressing daily and as need.  Keep f/u apt with Dr Johney Maine next week.

## 2015-11-08 ENCOUNTER — Encounter: Payer: Self-pay | Admitting: Internal Medicine

## 2016-09-07 ENCOUNTER — Other Ambulatory Visit: Payer: Self-pay | Admitting: Plastic Surgery

## 2017-01-08 ENCOUNTER — Telehealth: Payer: Self-pay | Admitting: Internal Medicine

## 2017-01-08 DIAGNOSIS — Z0000 Encounter for general adult medical examination without abnormal findings: Secondary | ICD-10-CM

## 2017-01-08 NOTE — Telephone Encounter (Signed)
Dr. Lenard Forth, pt does have an appt on 01/11/2017. Are you willing to order bloodwork first so he can have the results at his appt. Please advise.

## 2017-01-09 NOTE — Telephone Encounter (Signed)
MW please advise. thanks 

## 2017-01-09 NOTE — Telephone Encounter (Signed)
Spoke with patient. He is aware that the lab orders will be placed. Advised patient that if he wanted the results be ready in time of his appt, he needed to come the day before his appt. He verbalized understanding. Nothing else needed at time of call.

## 2017-01-09 NOTE — Telephone Encounter (Signed)
Bmet, cbc with diff, lfts lipid profile, u/a, tsh  Dx health maintenance

## 2017-01-10 ENCOUNTER — Other Ambulatory Visit (INDEPENDENT_AMBULATORY_CARE_PROVIDER_SITE_OTHER): Payer: Managed Care, Other (non HMO)

## 2017-01-10 DIAGNOSIS — Z0000 Encounter for general adult medical examination without abnormal findings: Secondary | ICD-10-CM | POA: Diagnosis not present

## 2017-01-10 LAB — BASIC METABOLIC PANEL
BUN: 18 mg/dL (ref 6–23)
CALCIUM: 10.2 mg/dL (ref 8.4–10.5)
CHLORIDE: 100 meq/L (ref 96–112)
CO2: 29 meq/L (ref 19–32)
Creatinine, Ser: 0.95 mg/dL (ref 0.40–1.50)
GFR: 87.58 mL/min (ref >60.00)
GLUCOSE: 101 mg/dL — AB (ref 70–99)
POTASSIUM: 5.3 meq/L — AB (ref 3.5–5.1)
SODIUM: 137 meq/L (ref 135–145)

## 2017-01-10 LAB — URINALYSIS
BILIRUBIN URINE: NEGATIVE
LEUKOCYTES UA: NEGATIVE
NITRITE: NEGATIVE
SPECIFIC GRAVITY, URINE: 1.01 (ref 1.000–1.030)
Urine Glucose: NEGATIVE
Urobilinogen, UA: 0.2 (ref 0.0–1.0)
pH: 6.5 (ref 5.0–8.0)

## 2017-01-10 LAB — HEPATIC FUNCTION PANEL
ALK PHOS: 63 U/L (ref 39–117)
ALT: 24 U/L (ref 0–53)
AST: 20 U/L (ref 0–37)
Albumin: 4.5 g/dL (ref 3.5–5.2)
BILIRUBIN DIRECT: 0.1 mg/dL (ref 0.0–0.3)
BILIRUBIN TOTAL: 0.6 mg/dL (ref 0.2–1.2)
Total Protein: 7.9 g/dL (ref 6.0–8.3)

## 2017-01-10 LAB — LIPID PANEL
CHOL/HDL RATIO: 5
Cholesterol: 270 mg/dL — ABNORMAL HIGH (ref 0–200)
HDL: 56 mg/dL (ref >39.00)
NONHDL: 213.92
Triglycerides: 221 mg/dL — ABNORMAL HIGH (ref 0.0–149.0)
VLDL: 44.2 mg/dL — AB (ref 0.0–40.0)

## 2017-01-10 LAB — CBC WITH DIFFERENTIAL/PLATELET
BASOS ABS: 0 10*3/uL (ref 0.0–0.1)
Basophils Relative: 0.5 % (ref 0.0–3.0)
EOS PCT: 1.8 % (ref 0.0–5.0)
Eosinophils Absolute: 0.1 10*3/uL (ref 0.0–0.7)
HEMATOCRIT: 45.3 % (ref 39.0–52.0)
Hemoglobin: 15.2 g/dL (ref 13.0–17.0)
LYMPHS PCT: 25.6 % (ref 12.0–46.0)
Lymphs Abs: 1.8 10*3/uL (ref 0.7–4.0)
MCHC: 33.5 g/dL (ref 30.0–36.0)
MCV: 86.4 fl (ref 78.0–100.0)
MONOS PCT: 9.5 % (ref 3.0–12.0)
Monocytes Absolute: 0.7 10*3/uL (ref 0.1–1.0)
NEUTROS ABS: 4.4 10*3/uL (ref 1.4–7.7)
Neutrophils Relative %: 62.6 % (ref 43.0–77.0)
PLATELETS: 326 10*3/uL (ref 150.0–400.0)
RBC: 5.24 Mil/uL (ref 4.22–5.81)
RDW: 12.8 % (ref 11.5–15.5)
WBC: 7 10*3/uL (ref 4.0–10.5)

## 2017-01-10 LAB — TSH: TSH: 2.22 u[IU]/mL (ref 0.35–4.50)

## 2017-01-10 LAB — LDL CHOLESTEROL, DIRECT: LDL DIRECT: 163 mg/dL

## 2017-01-11 ENCOUNTER — Encounter: Payer: Self-pay | Admitting: Internal Medicine

## 2017-01-11 ENCOUNTER — Ambulatory Visit (INDEPENDENT_AMBULATORY_CARE_PROVIDER_SITE_OTHER): Payer: Managed Care, Other (non HMO) | Admitting: Internal Medicine

## 2017-01-11 VITALS — BP 146/84 | HR 101 | Ht 71.0 in | Wt 194.0 lb

## 2017-01-11 DIAGNOSIS — I10 Essential (primary) hypertension: Secondary | ICD-10-CM | POA: Diagnosis not present

## 2017-01-11 DIAGNOSIS — E7849 Other hyperlipidemia: Secondary | ICD-10-CM

## 2017-01-11 DIAGNOSIS — R202 Paresthesia of skin: Secondary | ICD-10-CM | POA: Diagnosis not present

## 2017-01-11 DIAGNOSIS — E784 Other hyperlipidemia: Secondary | ICD-10-CM

## 2017-01-11 DIAGNOSIS — Z0000 Encounter for general adult medical examination without abnormal findings: Secondary | ICD-10-CM | POA: Diagnosis not present

## 2017-01-11 DIAGNOSIS — Z23 Encounter for immunization: Secondary | ICD-10-CM

## 2017-01-11 DIAGNOSIS — Z860100 Personal history of colon polyps, unspecified: Secondary | ICD-10-CM

## 2017-01-11 DIAGNOSIS — R200 Anesthesia of skin: Secondary | ICD-10-CM | POA: Diagnosis not present

## 2017-01-11 DIAGNOSIS — I4510 Unspecified right bundle-branch block: Secondary | ICD-10-CM | POA: Diagnosis not present

## 2017-01-11 MED ORDER — NEBIVOLOL HCL 10 MG PO TABS: 10.0000 mg | ORAL_TABLET | Freq: Every day | ORAL | 11 refills | Status: DC

## 2017-01-11 NOTE — Progress Notes (Signed)
Subjective:    Patient ID: Louis Lamb, male    DOB: 12/29/1960    MRN: [id number]    Brief patient profile:  80 yowm never smoker with borderline hbp/ situational   01/11/2017  [facility] office visit/ Latif Nazareno  To re-establish for Primary care - last seen 2014  Chief Complaint  Patient presents with  . Annual Exam    Labs done on 01/10/17. Pt states that he has noticed burning and pain in his feet off and on for the past 6-8 months.   planning to meet father next week in [place]/ so far no surprises in fm hx but plans to get more info soon   No regular aerobics - only new problem is intermittent numbness bottoms of both feet L > R worse at night / no pain/ already under the care of podiatrist but hasn't mentioned this as not seen in 6-8 m since onset, no real progression since then Denies excess etoh, does take vit/ no h/o dm   No obvious claudication, leg weakness  or cp or chest tightness, subjective wheeze or overt sinus or hb symptoms. No unusual exp hx or h/o childhood pna/ asthma or knowledge of premature birth.  Sleeping ok flat without nocturnal  or early am exacerbation  of respiratory  c/o's or need for noct saba. Also denies any obvious fluctuation of symptoms with weather or environmental changes or other aggravating or alleviating factors except as outlined above   Current Allergies, Complete Past Medical History, Past Surgical History, Family History, and Social History were reviewed in [employer] record.  ROS  The following are not active complaints unless bolded sore throat, dysphagia, dental problems, itching, sneezing,  nasal congestion or disharge of excess mucus or purulent secretions, ear ache,   fever, chills, sweats, unintended wt loss or wt gain, classically pleuritic or exertional cp,  orthopnea pnd or leg swelling, presyncope, palpitations, abdominal pain, anorexia, nausea, vomiting, diarrhea  or change in bowel habits or bladder  habits, change in stools or change in urine, dysuria, hematuria,  rash, arthralgias, visual complaints, headache, numbness, weakness or ataxia or problems with walking or coordination,  change in mood/affect or memory.        Current Meds  Medication Sig  . Amphetamine ER (ADZENYS XR-ODT) 12.5 MG TBED Take 1 tablet by mouth daily.  Marland Kitchen aspirin 81 MG tablet Take 81 mg by mouth daily.                     Past Medical History:  ADD   .................................................................  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   NECK:  Supple w/ fair ROM; no JVD; normal carotid impulses w/o bruits; no thyromegaly or nodules palpated; no lymphadenopathy.    RESP  Clear  P & A; w/o, wheezes/ rales/ or rhonchi. no accessory muscle use, no dullness to percussion  CARD:  RRR, no m/r/g  , no peripheral edema, pulses intact, no cyanosis or clubbing. Good pulses all 4 ext  GI:   Soft & nt; nml bowel sounds; no organomegaly or masses detected.   Musco: Warm bil, no deformities or joint swelling noted.   Neuro: alert, no focal deficits noted.    Skin: Warm, no lesions or rashes > pencil eraser nevus L  thigh, smooth borders, homogeneous (< dime size)  GU Circ, no IH, no nodules  Prostate nl, no nodules, stool g neg  Neuro  Mood and affect and sensorium nl, no motor def or path reflexes, nl cerebellar testing - reflexes are diffusely diminished no more so in ankles than elbows    Labs ordered/ reviewed:      Chemistry      Component Value Date/Time   NA 137 01/10/2017 0802   K 5.3 (H) 01/10/2017 0802   CL 100 01/10/2017 0802   CO2 29 01/10/2017 0802   BUN 18 01/10/2017 0802   CREATININE 0.95 01/10/2017 0802      Component Value Date/Time   CALCIUM 10.2 01/10/2017 0802   ALKPHOS 63 01/10/2017 0802   AST 20 01/10/2017 0802   ALT 24 01/10/2017 0802   BILITOT 0.6 01/10/2017 0802        Lab Results  Component Value Date   WBC 7.0 01/10/2017   HGB 15.2 01/10/2017   HCT 45.3 01/10/2017   MCV 86.4 01/10/2017   PLT 326.0 01/10/2017        Lab Results  Component Value Date   TSH 2.22 01/10/2017                Assessment & Plan:

## 2017-01-11 NOTE — Patient Instructions (Addendum)
Bystolic 10 mg daily   Td and pneumovax and flu shots today   Please schedule a follow up visit in 3 months but call sooner if needed

## 2017-01-12 DIAGNOSIS — I4510 Unspecified right bundle-branch block: Secondary | ICD-10-CM | POA: Insufficient documentation

## 2017-01-12 DIAGNOSIS — R200 Anesthesia of skin: Secondary | ICD-10-CM | POA: Insufficient documentation

## 2017-01-12 DIAGNOSIS — R202 Paresthesia of skin: Secondary | ICD-10-CM

## 2017-01-12 NOTE — Assessment & Plan Note (Signed)
Onset early 2018 under care of podiatrist previously   rec see podiatry first and consider neuro referral, eliminate etoh as much as possible and be sure multivit has plenty of B vitamins / thiamine / no evidence of circulatory issue or dm

## 2017-01-12 NOTE — Assessment & Plan Note (Signed)
-   Target LDL < 130 as Type A/  hbp  Lab Results  Component Value Date   CHOL 270 (H) 01/10/2017   HDL 56.00 01/10/2017   LDLDIRECT 163.0 01/10/2017   TRIG 221.0 (H) 01/10/2017   CHOLHDL 5 01/10/2017   Advised on diet /ex/ encourage re high HDL so hold off on statin for now

## 2017-01-12 NOTE — Assessment & Plan Note (Signed)
-   Colonoscopy rec 09/05/2012 > repeat 10/2015 planned by Carlean Purl but not done as of 01/11/2017 > needs to do - declined psa testing 01/11/2017  - vaccinations updated

## 2017-01-12 NOTE — Assessment & Plan Note (Signed)
-   started bystolic 10 mg daily 05/08/4823 >>> recheckin 3 m  Lab Results  Component Value Date   CREATININE 0.95 01/10/2017   CREATININE 0.9 09/04/2012   CREATININE 0.9 08/17/2011

## 2017-01-12 NOTE — Assessment & Plan Note (Signed)
10/2012 - 2 sessile serrated adenomas and 1 tubular adenoma - repeat colonoscopy about 10/2015 Louis Mayer, MD, FACG> letter received as of 01/11/2017 but did not make appt > rec he do so

## 2017-01-12 NOTE — Assessment & Plan Note (Signed)
Known since at lest 08/2011  > no change 01/11/2017

## 2017-03-01 ENCOUNTER — Other Ambulatory Visit: Payer: Self-pay

## 2017-03-01 ENCOUNTER — Ambulatory Visit (AMBULATORY_SURGERY_CENTER): Payer: Self-pay

## 2017-03-01 VITALS — Ht 72.0 in | Wt 196.2 lb

## 2017-03-01 DIAGNOSIS — Z86010 Personal history of colonic polyps: Secondary | ICD-10-CM

## 2017-03-01 NOTE — Progress Notes (Signed)
Denies allergies to eggs or soy products. Denies complication of anesthesia or sedation. Denies use of weight loss medication. Denies use of O2.   Emmi instructions declined.  

## 2017-03-06 ENCOUNTER — Encounter: Payer: Self-pay | Admitting: Internal Medicine

## 2017-03-15 ENCOUNTER — Other Ambulatory Visit: Payer: Self-pay

## 2017-03-15 ENCOUNTER — Ambulatory Visit (AMBULATORY_SURGERY_CENTER): Payer: Managed Care, Other (non HMO) | Admitting: Internal Medicine

## 2017-03-15 ENCOUNTER — Encounter: Payer: Self-pay | Admitting: Internal Medicine

## 2017-03-15 DIAGNOSIS — Z86010 Personal history of colonic polyps: Secondary | ICD-10-CM

## 2017-03-15 DIAGNOSIS — Z0000 Encounter for general adult medical examination without abnormal findings: Secondary | ICD-10-CM | POA: Diagnosis not present

## 2017-03-15 NOTE — Progress Notes (Signed)
To recovery, report to RN, VSS. 

## 2017-03-15 NOTE — Patient Instructions (Addendum)
No polyps this time.  The guidelines indicate you should repeat a colonoscopy in about 5 years.  I appreciate the opportunity to care for you. Gatha Mayer, MD, FACG  YOU HAD AN ENDOSCOPIC PROCEDURE TODAY AT [facility]:   Refer to the procedure report that was given to you for any specific questions about what was found during the examination.  If the procedure report does not answer your questions, please call your gastroenterologist to clarify.  If you requested that your care partner not be given the details of your procedure findings, then the procedure report has been included in a sealed envelope for you to review at your convenience later.  YOU SHOULD EXPECT: Some feelings of bloating in the abdomen. Passage of more gas than usual.  Walking can help get rid of the air that was put into your GI tract during the procedure and reduce the bloating. If you had a lower endoscopy (such as a colonoscopy or flexible sigmoidoscopy) you may notice spotting of blood in your stool or on the toilet paper. If you underwent a bowel prep for your procedure, you may not have a normal bowel movement for a few days.  Please Note:  You might notice some irritation and congestion in your nose or some drainage.  This is from the oxygen used during your procedure.  There is no need for concern and it should clear up in a day or so.  SYMPTOMS TO REPORT IMMEDIATELY:   Following lower endoscopy (colonoscopy or flexible sigmoidoscopy):  Excessive amounts of blood in the stool  Significant tenderness or worsening of abdominal pains  Swelling of the abdomen that is new, acute  Fever of 100F or higher   Following upper endoscopy (EGD)  Vomiting of blood or coffee ground material  New chest pain or pain under the shoulder blades  Painful or persistently difficult swallowing  New shortness of breath  Fever of 100F or higher  Black, tarry-looking stools  For urgent or emergent  issues, a gastroenterologist can be reached at any hour by calling [phone].   DIET:  We do recommend a small meal at first, but then you may proceed to your regular diet.  Drink plenty of fluids but you should avoid alcoholic beverages for 24 hours.  ACTIVITY:  You should plan to take it easy for the rest of today and you should NOT DRIVE or use heavy machinery until tomorrow (because of the sedation medicines used during the test).    FOLLOW UP: Our staff will call the number listed on your records the next business day following your procedure to check on you and address any questions or concerns that you may have regarding the information given to you following your procedure. If we do not reach you, we will leave a message.  However, if you are feeling well and you are not experiencing any problems, there is no need to return our call.  We will assume that you have returned to your regular daily activities without incident.  If any biopsies were taken you will be contacted by phone or by letter within the next 1-3 weeks.  Please call us at [phone] if you have not heard about the biopsies in 3 weeks.    SIGNATURES/CONFIDENTIALITY: You and/or your care partner have signed paperwork which will be entered into your electronic medical record.  These signatures attest to the fact that that the information above on your After Visit Summary has  been reviewed and is understood.  Full responsibility of the confidentiality of this discharge information lies with you and/or your care-partner.YOU HAD AN ENDOSCOPIC PROCEDURE TODAY AT [facility]:   Refer to the procedure report that was given to you for any specific questions about what was found during the examination.  If the procedure report does not answer your questions, please call your gastroenterologist to clarify.  If you requested that your care partner not be given the details of your procedure findings, then the  procedure report has been included in a sealed envelope for you to review at your convenience later.  YOU SHOULD EXPECT: Some feelings of bloating in the abdomen. Passage of more gas than usual.  Walking can help get rid of the air that was put into your GI tract during the procedure and reduce the bloating. If you had a lower endoscopy (such as a colonoscopy or flexible sigmoidoscopy) you may notice spotting of blood in your stool or on the toilet paper. If you underwent a bowel prep for your procedure, you may not have a normal bowel movement for a few days.  Please Note:  You might notice some irritation and congestion in your nose or some drainage.  This is from the oxygen used during your procedure.  There is no need for concern and it should clear up in a day or so.  SYMPTOMS TO REPORT IMMEDIATELY:   Following lower endoscopy (colonoscopy or flexible sigmoidoscopy):  Excessive amounts of blood in the stool  Significant tenderness or worsening of abdominal pains  Swelling of the abdomen that is new, acute  Fever of 100F or higher   Following upper endoscopy (EGD)  Vomiting of blood or coffee ground material  New chest pain or pain under the shoulder blades  Painful or persistently difficult swallowing  New shortness of breath  Fever of 100F or higher  Black, tarry-looking stools  For urgent or emergent issues, a gastroenterologist can be reached at any hour by calling [phone].   DIET:  We do recommend a small meal at first, but then you may proceed to your regular diet.  Drink plenty of fluids but you should avoid alcoholic beverages for 24 hours.  ACTIVITY:  You should plan to take it easy for the rest of today and you should NOT DRIVE or use heavy machinery until tomorrow (because of the sedation medicines used during the test).    FOLLOW UP: Our staff will call the number listed on your records the next business day following your procedure to check on you and address  any questions or concerns that you may have regarding the information given to you following your procedure. If we do not reach you, we will leave a message.  However, if you are feeling well and you are not experiencing any problems, there is no need to return our call.  We will assume that you have returned to your regular daily activities without incident.  If any biopsies were taken you will be contacted by phone or by letter within the next 1-3 weeks.  Please call us at [phone] if you have not heard about the biopsies in 3 weeks.    SIGNATURES/CONFIDENTIALITY: You and/or your care partner have signed paperwork which will be entered into your electronic medical record.  These signatures attest to the fact that that the information above on your After Visit Summary has been reviewed and is understood.  Full responsibility of the confidentiality of  this discharge information lies with you and/or your care-partner.

## 2017-03-15 NOTE — Op Note (Signed)
[facility] Patient Name: Louis Lamb Procedure Date: 03/15/2017 11:00 AM MRN: [id number] Endoscopist: Gatha Mayer , MD Age: 56 Referring MD:  Date of Birth: 02-Apr-1961 Gender: Male Account #: [id number] Procedure:                Colonoscopy Indications:              High risk colon cancer surveillance: Personal                            history of colonic polyps Medicines:                Propofol per Anesthesia, Monitored Anesthesia Care Procedure:                Pre-Anesthesia Assessment:                           - Prior to the procedure, a History and Physical                            was performed, and patient medications and                            allergies were reviewed. The patient's tolerance of                            previous anesthesia was also reviewed. The risks                            and benefits of the procedure and the sedation                            options and risks were discussed with the patient.                            All questions were answered, and informed consent                            was obtained. Prior Anticoagulants: The patient has                            taken no previous anticoagulant or antiplatelet                            agents. ASA Grade Assessment: II - A patient with                            mild systemic disease. After reviewing the risks                            and benefits, the patient was deemed in                            satisfactory condition to undergo the procedure.  After obtaining informed consent, the colonoscope                            was passed under direct vision. Throughout the                            procedure, the patient's blood pressure, pulse, and                            oxygen saturations were monitored continuously. The                            Colonoscope was introduced through the anus and                            advanced to the  the cecum, identified by                            appendiceal orifice and ileocecal valve. The                            quality of the bowel preparation was excellent. The                            colonoscopy was performed without difficulty. The                            patient tolerated the procedure well. The bowel                            preparation used was Miralax. The ileocecal valve,                            appendiceal orifice, and rectum were photographed. Scope In: 11:09:19 AM Scope Out: 11:24:38 AM Scope Withdrawal Time: 0 hours 8 minutes 14 seconds  Total Procedure Duration: 0 hours 15 minutes 19 seconds  Findings:                 The perianal and digital rectal examinations were                            normal. Pertinent negatives include normal prostate                            (size, shape, and consistency).                           The colon (entire examined portion) appeared normal.                           No additional abnormalities were found on                            retroflexion. Complications:            No  immediate complications. Estimated blood loss:                            None. Estimated Blood Loss:     Estimated blood loss: none. Recommendation:           - Repeat colonoscopy in 5 years for surveillance.                           - Patient has a contact number available for                            emergencies. The signs and symptoms of potential                            delayed complications were discussed with the                            patient. Return to normal activities tomorrow.                            Written discharge instructions were provided to the                            patient.                           - Resume previous diet.                           - Continue present medications. Gatha Mayer, MD 03/15/2017 11:32:36 AM This report has been signed electronically.

## 2017-03-15 NOTE — Progress Notes (Signed)
Pt's states no medical or surgical changes since previsit or office visit. 

## 2017-03-18 ENCOUNTER — Telehealth: Payer: Self-pay | Admitting: *Deleted

## 2017-03-18 ENCOUNTER — Telehealth: Payer: Self-pay

## 2017-03-18 NOTE — Telephone Encounter (Signed)
No answer for second follow up call. Sm

## 2017-03-18 NOTE — Telephone Encounter (Signed)
Name identifier, left a message. 

## 2017-04-22 ENCOUNTER — Encounter: Payer: Self-pay | Admitting: Internal Medicine

## 2017-04-22 ENCOUNTER — Ambulatory Visit (INDEPENDENT_AMBULATORY_CARE_PROVIDER_SITE_OTHER): Payer: Managed Care, Other (non HMO) | Admitting: Internal Medicine

## 2017-04-22 VITALS — BP 118/68 | HR 68 | Ht 72.0 in | Wt 193.8 lb

## 2017-04-22 DIAGNOSIS — I10 Essential (primary) hypertension: Secondary | ICD-10-CM

## 2017-04-22 DIAGNOSIS — J309 Allergic rhinitis, unspecified: Secondary | ICD-10-CM

## 2017-04-22 MED ORDER — NEBIVOLOL HCL 5 MG PO TABS: 5.0000 mg | ORAL_TABLET | Freq: Every day | ORAL | 11 refills | Status: DC

## 2017-04-22 NOTE — Assessment & Plan Note (Signed)
-   started bystolic 10 mg daily 0/06/7942 >>> ok to  reduced to 5 mg daily as of 05/17/17 as may be a bit over-beta blocked at this point  Reviewed approp meds for uri to avoid pseudofed if possible   see avs for instructions unique to this ov    Each maintenance medication was reviewed in detail including most importantly the difference between maintenance and as needed and under what circumstances the prns are to be used.  Please see AVS for specific  Instructions which are unique to this visit and I personally typed out  which were reviewed in detail in writing with the patient and a copy provided.

## 2017-04-22 NOTE — Patient Instructions (Addendum)
Best rx for drippy nose = zyrtec 10 mg daily    Decrease bystolic to 5 mg daily next refill    Please schedule a follow up visit in 6 months but call sooner if needed

## 2017-04-22 NOTE — Progress Notes (Signed)
Subjective:    Patient ID: Louis Lamb, male    DOB: [birth year]    MRN: [id number]    Brief patient profile:  22 yowm never smoker with borderline hbp/ situational   01/11/2017  [facility] office visit/ Wert  To re-establish for Primary care - last seen 2014  Chief Complaint  Patient presents with  . Annual Exam    Labs done on 01/10/17. Pt states that he has noticed burning and pain in his feet off and on for the past 6-8 months.   planning to meet father next week in [place]/ so far no surprises in fm hx but plans to get more info soon  No regular aerobics - only new problem is intermittent numbness bottoms of both feet L > R worse at night / no pain/ already under the care of podiatrist but hasn't mentioned this as not seen in 6-8 m since onset, no real progression since then  rec Bystolic 10 mg daily  Td and pneumovax and flu shots today     04/22/2017  f/u ov/Wert re: hbp/ cough p uri Chief Complaint  Patient presents with  . Follow-up    non productive cough,nasal congestion, heartburn,indigestion  some decreased energy on bystolic 10 mg daily but no problem with ED  [place] since xmas 2018 but no sob/ wheeze - mostly just daytime pnds / uncertain as to what otcs to use as has relied on advil cold and sinus in past  No obvious day to day or daytime variability or assoc excess/ purulent sputum or mucus plugs or hemoptysis or cp or chest tightness, subjective wheeze.  No unusual exposure hx or h/o childhood pna/ asthma or knowledge of premature birth.  Sleeping ok flat without nocturnal  or early am exacerbation  of respiratory  c/o's or need for noct saba. Also denies any obvious fluctuation of symptoms with weather or environmental changes or other aggravating or alleviating factors except as outlined above   Current Allergies, Complete Past Medical History, Past Surgical History, Family History, and Social History were reviewed in [employer]  record.  ROS  The following are not active complaints unless bolded Hoarseness, sore throat, dysphagia, dental problems, itching, sneezing,  nasal congestion or discharge of excess mucus or purulent secretions, ear ache,   fever, chills, sweats, unintended wt loss or wt gain, classically pleuritic or exertional cp,  orthopnea pnd or leg swelling, presyncope, palpitations, abdominal pain, anorexia, nausea, vomiting, diarrhea  or change in bowel habits or change in bladder habits, change in stools or change in urine, dysuria, hematuria,  rash, arthralgias, visual complaints, headache, numbness, weakness or ataxia or problems with walking or coordination,  change in mood/affect or memory.        Current Meds  Medication Sig  . Amphetamine ER (ADZENYS XR-ODT) 12.5 MG TBED Take 1 tablet by mouth daily.  Marland Kitchen aspirin 81 MG tablet Take 81 mg by mouth daily.    . [DISCONTINUED] nebivolol (BYSTOLIC) 10 MG tablet Take 1 tablet (10 mg total) by mouth daily.              Past Medical History:  ADD   .................................................................  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 Nl external ear canals without cough reflex   NECK :  without JVD/Nodes/TM/ nl carotid upstrokes bilaterally   LUNGS: no acc muscle use,  Nl contour chest which is clear to A and P bilaterally without cough on insp or exp maneuvers   CV:  RRR  no s3 or murmur or increase in P2, and no edema   ABD:  soft and nontender with nl inspiratory excursion in the supine position. No bruits or organomegaly appreciated, bowel sounds nl  MS:  Nl gait/ ext warm without deformities, calf tenderness, cyanosis or clubbing No obvious joint restrictions   SKIN: warm and dry without lesions    NEURO:  alert, approp, nl sensorium with  no motor or cerebellar deficits apparent.                       Assessment & Plan:

## 2017-04-22 NOTE — Assessment & Plan Note (Signed)
-   Allergy profile September 28, 2009 >>>  IgE 7.7. No allergens identified    Mild flare in setting of uri > try zyrtec prn

## 2017-05-06 ENCOUNTER — Encounter: Payer: Self-pay | Admitting: Internal Medicine

## 2017-05-06 NOTE — Telephone Encounter (Signed)
Dr. Melvyn Novas please advise. Thanks.   Pt's name and  DOB is Louis Lamb DOB [birth year]. This chart belongs to pt's son (DRP)

## 2017-10-25 ENCOUNTER — Ambulatory Visit: Payer: Managed Care, Other (non HMO) | Admitting: Internal Medicine

## 2017-10-28 ENCOUNTER — Ambulatory Visit (INDEPENDENT_AMBULATORY_CARE_PROVIDER_SITE_OTHER): Payer: Managed Care, Other (non HMO) | Admitting: Internal Medicine

## 2017-10-28 ENCOUNTER — Encounter: Payer: Self-pay | Admitting: Internal Medicine

## 2017-10-28 VITALS — BP 124/70 | HR 60 | Ht 72.0 in | Wt 191.2 lb

## 2017-10-28 DIAGNOSIS — I4510 Unspecified right bundle-branch block: Secondary | ICD-10-CM | POA: Diagnosis not present

## 2017-10-28 DIAGNOSIS — I10 Essential (primary) hypertension: Secondary | ICD-10-CM | POA: Diagnosis not present

## 2017-10-28 DIAGNOSIS — E785 Hyperlipidemia, unspecified: Secondary | ICD-10-CM | POA: Diagnosis not present

## 2017-10-28 DIAGNOSIS — J309 Allergic rhinitis, unspecified: Secondary | ICD-10-CM | POA: Diagnosis not present

## 2017-10-28 NOTE — Assessment & Plan Note (Signed)
-   started bystolic 10 mg daily 9/48/3475 >>> reduced to 5 mg daily as of 05/17/17   Adequate control on present rx, reviewed in detail with pt > no change in rx needed    cpx due at 3 months

## 2017-10-28 NOTE — Patient Instructions (Signed)
Work on diet /exercise and return in 3 months of CPX

## 2017-10-28 NOTE — Assessment & Plan Note (Signed)
-   Target LDL < 130 as Type A/  hbp  Lab Results  Component Value Date   CHOL 270 (H) 01/10/2017   HDL 56.00 01/10/2017   LDLDIRECT 163.0 01/10/2017   TRIG 221.0 (H) 01/10/2017   CHOLHDL 5 01/10/2017    Discussed options, prefers diet / ex since  hdl so high it is somewhat protective

## 2017-10-28 NOTE — Progress Notes (Signed)
Subjective:    Patient ID: Louis Lamb, male    DOB: [birth year]    MRN: [id number]    Brief patient profile:  57   yowm never smoker with borderline hbp/ situational    History of Present Illness  01/11/2017  [facility] office visit/ Wert  To re-establish for Primary care - last seen 2014  Chief Complaint  Patient presents with  . Annual Exam    Labs done on 01/10/17. Pt states that he has noticed burning and pain in his feet off and on for the past 6-8 months.   planning to meet father next week in [place]/ so far no surprises in fm hx but plans to get more info soon  No regular aerobics - only new problem is intermittent numbness bottoms of both feet L > R worse at night / no pain/ already under the care of podiatrist but hasn't mentioned this as not seen in 6-8 m since onset, no real progression since then  rec Bystolic 10 mg daily  Td and pneumovax and flu shots today     04/22/2017  f/u ov/Wert re: hbp/ cough p uri Chief Complaint  Patient presents with  . Follow-up    non productive cough,nasal congestion, heartburn,indigestion  some decreased energy on bystolic 10 mg daily but no problem with ED  Samuel [place] since xmas 2018 but no sob/ wheeze - mostly just daytime pnds / uncertain as to what otcs to use as has relied on advil cold and sinus in past rec Best rx for drippy nose = zyrtec 10 mg daily  Decrease bystolic to 5 mg daily next refill  ease schedule a follow up visit in 6 months but call sooner if needed     10/28/2017  f/u ov/Wert re:  Hbp/ high chol  Chief Complaint  Patient presents with  . Follow-up    Doing well and no new co's.   no cp, tia / claudication / leg swelling    No obvious day to day or daytime variability or assoc excess/ purulent sputum or mucus plugs or hemoptysis   or chest tightness, subjective wheeze or overt sinus or hb symptoms.   Sleeping fine  without nocturnal  or early am exacerbation  of respiratory  c/o's or need for noct saba.  Also denies any obvious fluctuation of symptoms with weather or environmental changes or other aggravating or alleviating factors except as outlined above   No unusual exposure hx or h/o childhood pna/ asthma or knowledge of premature birth.  Current Allergies, Complete Past Medical History, Past Surgical History, Family History, and Social History were reviewed in [employer] record.  ROS  The following are not active complaints unless bolded Hoarseness, sore throat, dysphagia, dental problems, itching, sneezing,  nasal congestion or discharge of excess mucus or purulent secretions, ear ache,   fever, chills, sweats, unintended wt loss or wt gain, classically pleuritic or exertional cp,  orthopnea pnd or arm/hand swelling  or leg swelling, presyncope, palpitations, abdominal pain, anorexia, nausea, vomiting, diarrhea  or change in bowel habits or change in bladder habits, change in stools or change in urine, dysuria, hematuria,  rash, arthralgias, visual complaints, headache, numbness, weakness or ataxia or problems with walking or coordination,  change in mood or  memory.        Current Meds  Medication Sig  . Amphetamine ER (ADZENYS XR-ODT) 12.5 MG TBED Take 1 tablet by mouth daily.  Marland Kitchen aspirin 81 MG tablet Take 81  mg by mouth daily.    . nebivolol (BYSTOLIC) 5 MG tablet Take 1 tablet (5 mg total) by mouth daily.                  Past Medical History:  ADD   .................................................................  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

## 2018-01-27 ENCOUNTER — Ambulatory Visit: Payer: Managed Care, Other (non HMO) | Admitting: Internal Medicine

## 2018-02-14 ENCOUNTER — Other Ambulatory Visit (INDEPENDENT_AMBULATORY_CARE_PROVIDER_SITE_OTHER): Payer: Managed Care, Other (non HMO)

## 2018-02-14 ENCOUNTER — Other Ambulatory Visit: Payer: Self-pay

## 2018-02-14 DIAGNOSIS — I10 Essential (primary) hypertension: Secondary | ICD-10-CM

## 2018-02-14 DIAGNOSIS — Z125 Encounter for screening for malignant neoplasm of prostate: Secondary | ICD-10-CM

## 2018-02-14 DIAGNOSIS — J309 Allergic rhinitis, unspecified: Secondary | ICD-10-CM | POA: Diagnosis not present

## 2018-02-14 DIAGNOSIS — E785 Hyperlipidemia, unspecified: Secondary | ICD-10-CM

## 2018-02-14 DIAGNOSIS — Z0000 Encounter for general adult medical examination without abnormal findings: Secondary | ICD-10-CM

## 2018-02-14 LAB — BASIC METABOLIC PANEL
BUN: 12 mg/dL (ref 6–23)
CALCIUM: 10.2 mg/dL (ref 8.4–10.5)
CO2: 28 mEq/L (ref 19–32)
Chloride: 100 mEq/L (ref 96–112)
Creatinine, Ser: 0.9 mg/dL (ref 0.40–1.50)
GFR: 92.85 mL/min (ref >60.00)
Glucose, Bld: 108 mg/dL — ABNORMAL HIGH (ref 70–99)
POTASSIUM: 5.3 meq/L — AB (ref 3.5–5.1)
SODIUM: 138 meq/L (ref 135–145)

## 2018-02-14 LAB — HEPATIC FUNCTION PANEL
ALT: 19 U/L (ref 0–53)
AST: 19 U/L (ref 0–37)
Albumin: 4.5 g/dL (ref 3.5–5.2)
Alkaline Phosphatase: 58 U/L (ref 39–117)
BILIRUBIN DIRECT: 0.1 mg/dL (ref 0.0–0.3)
BILIRUBIN TOTAL: 0.5 mg/dL (ref 0.2–1.2)
Total Protein: 7.3 g/dL (ref 6.0–8.3)

## 2018-02-14 LAB — CBC WITH DIFFERENTIAL/PLATELET
BASOS PCT: 0.5 % (ref 0.0–3.0)
Basophils Absolute: 0 10*3/uL (ref 0.0–0.1)
EOS ABS: 0.1 10*3/uL (ref 0.0–0.7)
EOS PCT: 0.9 % (ref 0.0–5.0)
HCT: 42.9 % (ref 39.0–52.0)
Hemoglobin: 14.4 g/dL (ref 13.0–17.0)
Lymphocytes Relative: 19.7 % (ref 12.0–46.0)
Lymphs Abs: 1.4 10*3/uL (ref 0.7–4.0)
MCHC: 33.6 g/dL (ref 30.0–36.0)
MCV: 86.2 fl (ref 78.0–100.0)
Monocytes Absolute: 0.5 10*3/uL (ref 0.1–1.0)
Monocytes Relative: 7.2 % (ref 3.0–12.0)
Neutro Abs: 5.3 10*3/uL (ref 1.4–7.7)
Neutrophils Relative %: 71.7 % (ref 43.0–77.0)
Platelets: 313 10*3/uL (ref 150.0–400.0)
RBC: 4.98 Mil/uL (ref 4.22–5.81)
RDW: 12.6 % (ref 11.5–15.5)
WBC: 7.3 10*3/uL (ref 4.0–10.5)

## 2018-02-14 LAB — LIPID PANEL
CHOLESTEROL: 223 mg/dL — AB (ref 0–200)
HDL: 50.1 mg/dL (ref >39.00)
LDL Cholesterol: 141 mg/dL — ABNORMAL HIGH (ref 0–99)
NonHDL: 173.23
TRIGLYCERIDES: 163 mg/dL — AB (ref 0.0–149.0)
Total CHOL/HDL Ratio: 4
VLDL: 32.6 mg/dL (ref 0.0–40.0)

## 2018-02-14 LAB — HEMOGLOBIN A1C: Hgb A1c MFr Bld: 5.7 % (ref 4.6–6.5)

## 2018-02-14 LAB — TSH: TSH: 1.54 u[IU]/mL (ref 0.35–4.50)

## 2018-02-14 LAB — PSA: PSA: 0.88 ng/mL (ref 0.10–4.00)

## 2018-02-17 ENCOUNTER — Ambulatory Visit (INDEPENDENT_AMBULATORY_CARE_PROVIDER_SITE_OTHER): Payer: Managed Care, Other (non HMO) | Admitting: Internal Medicine

## 2018-02-17 ENCOUNTER — Encounter: Payer: Self-pay | Admitting: Internal Medicine

## 2018-02-17 ENCOUNTER — Other Ambulatory Visit: Payer: Managed Care, Other (non HMO)

## 2018-02-17 VITALS — BP 140/80 | HR 68 | Ht 72.0 in | Wt 195.6 lb

## 2018-02-17 DIAGNOSIS — E7800 Pure hypercholesterolemia, unspecified: Secondary | ICD-10-CM | POA: Diagnosis not present

## 2018-02-17 DIAGNOSIS — Z0000 Encounter for general adult medical examination without abnormal findings: Secondary | ICD-10-CM

## 2018-02-17 DIAGNOSIS — I10 Essential (primary) hypertension: Secondary | ICD-10-CM

## 2018-02-17 NOTE — Patient Instructions (Addendum)
Please remember to go to the lab department downstairs in the basement  for your tests - we will call you with the results when they are available.      Return in [facility] July 2019 for cpx

## 2018-02-17 NOTE — Progress Notes (Addendum)
Subjective:    Patient ID: Louis Lamb, male    DOB: 02-26-1961    MRN: [id number]    Brief patient profile:  57   yowm never smoker with borderline hbp/ situational    History of Present Illness  01/11/2017  [facility] office visit/ Sadhana Frater  To re-establish for Primary care - last seen 2014  Chief Complaint  Patient presents with  . Annual Exam    Labs done on 01/10/17. Pt states that he has noticed burning and pain in his feet off and on for the past 6-8 months.   planning to meet father next week in [place]/ so far no surprises in fm hx but plans to get more info soon  No regular aerobics - only new problem is intermittent numbness bottoms of both feet L > R worse at night / no pain/ already under the care of podiatrist but hasn't mentioned this as not seen in 6-8 m since onset, no real progression since then  rec Bystolic 10 mg daily  Td and pneumovax and flu shots today     04/22/2017  f/u ov/Indyah Saulnier re: hbp/ cough p uri Chief Complaint  Patient presents with  . Follow-up    non productive cough,nasal congestion, heartburn,indigestion  some decreased energy on bystolic 10 mg daily but no problem with ED  Samuel [place] since xmas 2018 but no sob/ wheeze - mostly just daytime pnds / uncertain as to what otcs to use as has relied on advil cold and sinus in past rec Best rx for drippy nose = zyrtec 10 mg daily  Decrease bystolic to 5 mg daily next refill  ease schedule a follow up visit in 6 months but call sooner if needed     10/28/2017  f/u ov/Nakeysha Pasqual re:  Hbp/ high chol  Chief Complaint  Patient presents with  . Follow-up    Doing well and no new co's.   no cp, tia / claudication / leg swelling  rec No change rx   02/17/2018  f/u ov/Malayla Granberry re: hbp/ mild chol Chief Complaint  Patient presents with  . Follow-up    follow up on annual exam and cholesterol  Dyspnea:  Nothing aerobic Cough: getting over uri/ min residual mucoid sputum  Sleeping: on side on one pillow SABA use:  none 02: none     No obvious day to day or daytime variability or assoc excess/ purulent sputum or mucus plugs or hemoptysis or cp or chest tightness, subjective wheeze or overt sinus or hb symptoms.   Sleeping ok  without nocturnal  or early am exacerbation  of respiratory  c/o's or need for noct saba. Also denies any obvious fluctuation of symptoms with weather or environmental changes or other aggravating or alleviating factors except as outlined above   No unusual exposure hx or h/o childhood pna/ asthma or knowledge of premature birth.  Current Allergies, Complete Past Medical History, Past Surgical History, Family History, and Social History were reviewed in [employer] record.  ROS  The following are not active complaints unless bolded Hoarseness, sore throat, dysphagia, dental problems, itching, sneezing,  nasal congestion or discharge of excess mucus or purulent secretions, ear ache,   fever, chills, sweats, unintended wt loss or wt gain, classically pleuritic or exertional cp,  orthopnea pnd or arm/hand swelling  or leg swelling, presyncope, palpitations, abdominal pain, anorexia, nausea, vomiting, diarrhea  or change in bowel habits or change in bladder habits, change in stools or change in urine,  dysuria, hematuria,  rash, arthralgias, visual complaints, headache, numbness, weakness or ataxia or problems with walking or coordination,  change in mood or  memory.        Current Meds  Medication Sig  . Amphetamine ER (ADZENYS XR-ODT) 12.5 MG TBED Take 1 tablet by mouth daily.  Marland Kitchen aspirin 81 MG tablet Take 81 mg by mouth daily.    . nebivolol (BYSTOLIC) 5 MG tablet Take 1 tablet (5 mg total) by mouth daily.                Past Medical History:  ADD   .................................................................  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 Nl external ear canals without cough reflex   NECK :  without JVD/Nodes/TM/ nl carotid upstrokes bilaterally   LUNGS: no acc muscle use,  Nl contour chest which is clear to A and P bilaterally without cough on insp or exp maneuvers   CV:  RRR  no s3 or murmur or increase in P2, and no edema   ABD:  soft and nontender with nl inspiratory excursion in the supine position. No bruits or organomegaly appreciated, bowel sounds nl  MS:  Nl gait/ ext warm without deformities, calf tenderness, cyanosis or clubbing No obvious joint restrictions   SKIN: warm and dry without lesions    NEURO:  alert, approp, nl sensorium with  no motor or cerebellar deficits apparent.       Labs ordered/ reviewed:      Chemistry      Component Value Date/Time   NA 138 02/14/2018 1307   K 5.3 (H) 02/14/2018 1307   CL 100 02/14/2018 1307   CO2 28 02/14/2018 1307   BUN 12 02/14/2018 1307   CREATININE 0.90 02/14/2018 1307      Component Value Date/Time   CALCIUM 10.2 02/14/2018 1307   ALKPHOS 58 02/14/2018 1307   AST 19  02/14/2018 1307   ALT 19 02/14/2018 1307   BILITOT 0.5 02/14/2018 1307        Lab Results  Component Value Date   WBC 7.3 02/14/2018   HGB 14.4 02/14/2018   HCT 42.9 02/14/2018   MCV 86.2  02/14/2018   PLT 313.0 02/14/2018       EOS                                                               0.1                                     02/17/2018       Lab Results  Component Value Date   TSH 1.54 02/14/2018           Lab Results  Component Value Date   PSA 0.88 02/14/2018   PSA 0.63 09/04/2012         Assessment & Plan:

## 2018-02-18 ENCOUNTER — Encounter: Payer: Self-pay | Admitting: Internal Medicine

## 2018-02-18 NOTE — Assessment & Plan Note (Signed)
-   Target LDL < 130 as Type A/  hbp   Lab Results  Component Value Date   CHOL 223 (H) 02/14/2018   HDL 50.10 02/14/2018   LDLCALC 141 (H) 02/14/2018   LDLDIRECT 163.0 01/10/2017   TRIG 163.0 (H) 02/14/2018   CHOLHDL 4 02/14/2018    High hdl/ldl protective, no need for statins at this point

## 2018-02-18 NOTE — Assessment & Plan Note (Signed)
Adequate control on present rx, reviewed in detail with pt > no change in rx needed  = bystolic 5 mg daily

## 2018-02-18 NOTE — Assessment & Plan Note (Signed)
-   Colonoscopy last 03/15/2017 no polyps - hx polyps so repeat 2023  Cotinine screening needed for job   All health maint utd - shingles vaccination optional

## 2018-02-20 LAB — NICOTINE/COTININE METABOLITES
COTININE: NOT DETECTED ng/mL
NICOTINE: NOT DETECTED ng/mL

## 2018-05-13 MED ORDER — OSELTAMIVIR PHOSPHATE 75 MG PO CAPS: 75.0000 mg | ORAL_CAPSULE | Freq: Two times a day (BID) | ORAL | 0 refills | Status: DC

## 2018-05-13 NOTE — Telephone Encounter (Signed)
Fine with me

## 2018-05-13 NOTE — Telephone Encounter (Signed)
Patient sent message today requesting prophylactic Tamiflu, because his Daughter was diagnosed with the flu today.  Dr. Melvyn Novas, please advise

## 2018-05-21 ENCOUNTER — Other Ambulatory Visit: Payer: Self-pay | Admitting: Internal Medicine

## 2019-04-30 ENCOUNTER — Ambulatory Visit (INDEPENDENT_AMBULATORY_CARE_PROVIDER_SITE_OTHER): Payer: Managed Care, Other (non HMO)

## 2019-04-30 ENCOUNTER — Other Ambulatory Visit: Payer: Self-pay

## 2019-04-30 ENCOUNTER — Telehealth: Payer: Self-pay | Admitting: Internal Medicine

## 2019-04-30 DIAGNOSIS — Z23 Encounter for immunization: Secondary | ICD-10-CM | POA: Diagnosis not present

## 2019-04-30 NOTE — Telephone Encounter (Signed)
Called and spoke with pt. Pt was wanting to see if he could still get the flu shot an I stated that we were still giving them. Pt wanted to come today to get the vaccine. Covid screen was negative so pt has been added on the injection schedule today at 4:30. Nothing further needed.

## 2019-05-27 ENCOUNTER — Other Ambulatory Visit: Payer: Self-pay | Admitting: Internal Medicine

## 2019-06-08 MED ORDER — NEBIVOLOL HCL 5 MG PO TABS: 5.0000 mg | ORAL_TABLET | Freq: Every day | ORAL | 0 refills | Status: DC

## 2019-07-01 ENCOUNTER — Other Ambulatory Visit: Payer: Self-pay | Admitting: Internal Medicine

## 2019-07-01 DIAGNOSIS — Z0000 Encounter for general adult medical examination without abnormal findings: Secondary | ICD-10-CM

## 2019-07-01 NOTE — Progress Notes (Signed)
Labs for cpx

## 2019-07-02 ENCOUNTER — Other Ambulatory Visit: Payer: Self-pay | Admitting: Internal Medicine

## 2019-07-02 ENCOUNTER — Other Ambulatory Visit (INDEPENDENT_AMBULATORY_CARE_PROVIDER_SITE_OTHER): Payer: Managed Care, Other (non HMO)

## 2019-07-02 DIAGNOSIS — Z0000 Encounter for general adult medical examination without abnormal findings: Secondary | ICD-10-CM | POA: Diagnosis not present

## 2019-07-02 LAB — CBC WITH DIFFERENTIAL/PLATELET
Basophils Absolute: 0 10*3/uL (ref 0.0–0.1)
Basophils Relative: 0.5 % (ref 0.0–3.0)
Eosinophils Absolute: 0.1 10*3/uL (ref 0.0–0.7)
Eosinophils Relative: 0.9 % (ref 0.0–5.0)
HCT: 43.7 % (ref 39.0–52.0)
Hemoglobin: 14.5 g/dL (ref 13.0–17.0)
Lymphocytes Relative: 21.8 % (ref 12.0–46.0)
Lymphs Abs: 1.5 10*3/uL (ref 0.7–4.0)
MCHC: 33.2 g/dL (ref 30.0–36.0)
MCV: 89 fl (ref 78.0–100.0)
Monocytes Absolute: 0.7 10*3/uL (ref 0.1–1.0)
Monocytes Relative: 10 % (ref 3.0–12.0)
Neutro Abs: 4.5 10*3/uL (ref 1.4–7.7)
Neutrophils Relative %: 66.8 % (ref 43.0–77.0)
Platelets: 309 10*3/uL (ref 150.0–400.0)
RBC: 4.91 Mil/uL (ref 4.22–5.81)
RDW: 12.5 % (ref 11.5–15.5)
WBC: 6.7 10*3/uL (ref 4.0–10.5)

## 2019-07-02 LAB — URINALYSIS
Bilirubin Urine: NEGATIVE
Hgb urine dipstick: NEGATIVE
Ketones, ur: NEGATIVE
Leukocytes,Ua: NEGATIVE
Nitrite: NEGATIVE
Specific Gravity, Urine: 1.015 (ref 1.000–1.030)
Total Protein, Urine: NEGATIVE
Urine Glucose: NEGATIVE
Urobilinogen, UA: 0.2 (ref 0.0–1.0)
pH: 7 (ref 5.0–8.0)

## 2019-07-02 LAB — BASIC METABOLIC PANEL WITH GFR
BUN: 13 mg/dL (ref 6–23)
CO2: 31 meq/L (ref 19–32)
Calcium: 9.9 mg/dL (ref 8.4–10.5)
Chloride: 99 meq/L (ref 96–112)
Creatinine, Ser: 0.94 mg/dL (ref 0.40–1.50)
GFR: 82.67 mL/min
Glucose, Bld: 106 mg/dL — ABNORMAL HIGH (ref 70–99)
Potassium: 3.8 meq/L (ref 3.5–5.1)
Sodium: 138 meq/L (ref 135–145)

## 2019-07-02 LAB — LIPID PANEL
Cholesterol: 208 mg/dL — ABNORMAL HIGH (ref 0–200)
HDL: 57.9 mg/dL (ref >39.00)
LDL Cholesterol: 126 mg/dL — ABNORMAL HIGH (ref 0–99)
NonHDL: 150.09
Total CHOL/HDL Ratio: 4
Triglycerides: 121 mg/dL (ref 0.0–149.0)
VLDL: 24.2 mg/dL (ref 0.0–40.0)

## 2019-07-02 LAB — HEPATIC FUNCTION PANEL
ALT: 19 U/L (ref 0–53)
AST: 20 U/L (ref 0–37)
Albumin: 4.2 g/dL (ref 3.5–5.2)
Alkaline Phosphatase: 55 U/L (ref 39–117)
Bilirubin, Direct: 0.1 mg/dL (ref 0.0–0.3)
Total Bilirubin: 0.7 mg/dL (ref 0.2–1.2)
Total Protein: 7.5 g/dL (ref 6.0–8.3)

## 2019-07-02 LAB — PSA: PSA: 0.79 ng/mL (ref 0.10–4.00)

## 2019-07-02 LAB — TSH: TSH: 1.81 u[IU]/mL (ref 0.35–4.50)

## 2019-07-03 ENCOUNTER — Encounter: Payer: Self-pay | Admitting: Internal Medicine

## 2019-07-03 ENCOUNTER — Other Ambulatory Visit: Payer: Self-pay

## 2019-07-03 ENCOUNTER — Ambulatory Visit (INDEPENDENT_AMBULATORY_CARE_PROVIDER_SITE_OTHER): Payer: Managed Care, Other (non HMO) | Admitting: Internal Medicine

## 2019-07-03 DIAGNOSIS — Z0000 Encounter for general adult medical examination without abnormal findings: Secondary | ICD-10-CM | POA: Diagnosis not present

## 2019-07-03 DIAGNOSIS — I10 Essential (primary) hypertension: Secondary | ICD-10-CM

## 2019-07-03 DIAGNOSIS — R202 Paresthesia of skin: Secondary | ICD-10-CM | POA: Insufficient documentation

## 2019-07-03 DIAGNOSIS — E7800 Pure hypercholesterolemia, unspecified: Secondary | ICD-10-CM | POA: Diagnosis not present

## 2019-07-03 DIAGNOSIS — I4510 Unspecified right bundle-branch block: Secondary | ICD-10-CM | POA: Diagnosis not present

## 2019-07-03 MED ORDER — NEBIVOLOL HCL 5 MG PO TABS: 5.0000 mg | ORAL_TABLET | Freq: Every day | ORAL | 11 refills | Status: DC

## 2019-07-03 NOTE — Assessment & Plan Note (Signed)
Known since at least 08/2011   No need for further w/u at this point

## 2019-07-03 NOTE — Patient Instructions (Addendum)
Centrum A to Z one daily and reduce alcohol as much as possible and if not better after several months call for neurology eval.  To get the most out of exercise, you need to be continuously aware that you are short of breath, but never out of breath, for 30 minutes at least 3 x weekly As you improve, it will actually be easier for you to do the same amount of exercise  in  30 minutes so always push to the level where you are short of breath.      Please schedule a follow up visit in 12  months but call sooner if needed

## 2019-07-03 NOTE — Progress Notes (Signed)
Subjective:    Patient ID: Louis Lamb, male    DOB: 08-29-1960    MRN: [id number]    Brief patient profile:  59   yowm never smoker with borderline hbp/ situational    History of Present Illness  01/11/2017  [facility] office visit/ Jalaiya Oyster  To re-establish for Primary care - last seen 2014  Chief Complaint  Patient presents with  . Annual Exam    Labs done on 01/10/17. Pt states that he has noticed burning and pain in his feet off and on for the past 6-8 months.   planning to meet father next week in [place]/ so far no surprises in fm hx but plans to get more info soon  No regular aerobics - only new problem is intermittent numbness bottoms of both feet L > R worse at night / no pain/ already under the care of podiatrist but hasn't mentioned this as not seen in 6-8 m since onset, no real progression since then  rec Bystolic 10 mg daily  Td and pneumovax and flu shots today     04/22/2017  f/u ov/Jaqueline Uber re: hbp/ cough p uri Chief Complaint  Patient presents with  . Follow-up    non productive cough,nasal congestion, heartburn,indigestion  some decreased energy on bystolic 10 mg daily but no problem with ED  Samuel [place] since xmas 2018 but no sob/ wheeze - mostly just daytime pnds / uncertain as to what otcs to use as has relied on advil cold and sinus in past rec Best rx for drippy nose = zyrtec 10 mg daily  Decrease bystolic to 5 mg daily next refill  ease schedule a follow up visit in 6 months but call sooner if needed     10/28/2017  f/u ov/Yacine Droz re:  Hbp/ high chol  Chief Complaint  Patient presents with  . Follow-up    Doing well and no new co's.   no cp, tia / claudication / leg swelling  rec No change rx   02/17/2018  f/u ov/Sylvia Helms re: hbp/ mild chol Chief Complaint  Patient presents with  . Follow-up    follow up on annual exam and cholesterol  Dyspnea:  Nothing aerobic Cough: getting over uri/ min residual mucoid sputum  Sleeping: on side on one pillow SABA use:  none 02: none   rec No change   07/03/2019  f/u ov/Rashana Andrew re: hbp / new paresthesias bottoms of both feet x months  Chief Complaint  Patient presents with  . Annual Exam    Labs done 07/02/2019. Pt c/o feet burning at night off and on most nights.    Dyspnea:  Walking 2-5 / day Cough: no Sleeping: anxiety SABA use: none 02: none   No obvious day to day or daytime variability or assoc excess/ purulent sputum or mucus plugs or hemoptysis or cp or chest tightness, subjective wheeze or overt sinus or hb symptoms.   Sleeping  without nocturnal  or early am exacerbation  of respiratory  c/o's or need for noct saba. Also denies any obvious fluctuation of symptoms with weather or environmental changes or other aggravating or alleviating factors except as outlined above   No unusual exposure hx or h/o childhood pna/ asthma or knowledge of premature birth.  Current Allergies, Complete Past Medical History, Past Surgical History, Family History, and Social History were reviewed in [employer] record.  ROS  The following are not active complaints unless bolded Hoarseness, sore throat, dysphagia, dental problems, itching, sneezing,  nasal congestion or discharge of excess mucus or purulent secretions, ear ache,   fever, chills, sweats, unintended wt loss or wt gain, classically pleuritic or exertional cp,  orthopnea pnd or arm/hand swelling  or leg swelling, presyncope, palpitations, abdominal pain, anorexia, nausea, vomiting, diarrhea  or change in bowel habits or change in bladder habits, change in stools or change in urine, dysuria, hematuria,  rash, arthralgias, visual complaints, headache, numbness, weakness or ataxia or problems with walking or coordination,  change in mood or  memory.        Current Meds  Medication Sig  . Amphetamine ER (ADZENYS XR-ODT) 12.5 MG TBED Take 1 tablet by mouth daily.  Marland Kitchen aspirin 81 MG tablet Take 81 mg by mouth daily.    . nebivolol  (BYSTOLIC) 5 MG tablet Take 1 tablet (5 mg total) by mouth daily.                Past Medical History:  Raynaud's    Around 2010 or earlier  ADD   .................................................................  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    NECK :  without JVD/Nodes/TM/ nl carotid upstrokes bilaterally   LUNGS: no acc muscle use,  Nl contour chest which is clear to A and P bilaterally without cough on insp or exp maneuvers   CV:  RRR  no s3 or murmur or increase in P2, and no edema   ABD:  soft and nontender with nl inspiratory excursion in the supine position. No bruits or organomegaly appreciated, bowel sounds nl  MS:  Nl gait/ ext warm without deformities, calf tenderness, cyanosis or clubbing No obvious joint restrictions   SKIN: warm and dry without lesions    NEURO:  alert, approp, nl sensorium with  no  motor or cerebellar deficits apparent/ nl proprioception  GU    Testes down s IH / nodules  Rectal Mild bph s nodules/ stool G neg     Labs ordered/ reviewed:      Chemistry  Component Value Date/Time   NA 138 07/02/2019 0857   K 3.8 07/02/2019 0857   CL 99 07/02/2019 0857   CO2 31 07/02/2019 0857   BUN 13 07/02/2019 0857   CREATININE 0.94 07/02/2019 0857      Component Value Date/Time   CALCIUM 9.9 07/02/2019 0857   ALKPHOS 55 07/02/2019 0857   AST 20 07/02/2019 0857   ALT 19 07/02/2019 0857   BILITOT 0.7 07/02/2019 0857        Lab Results  Component Value Date   WBC 6.7 07/02/2019   HGB 14.5 07/02/2019   HCT 43.7 07/02/2019   MCV 89.0 07/02/2019   PLT 309.0 07/02/2019         Lab Results  Component Value Date   TSH 1.81 07/02/2019       Lab Results  Component Value Date   PSA 0.79 07/02/2019   PSA 0.88 02/14/2018   PSA 0.63 09/04/2012       ekg 07/03/2019   sr with RBBB no change                 Assessment & Plan:

## 2019-07-03 NOTE — Assessment & Plan Note (Signed)
Target LDL < 130 as Type A/  hbp  Lab Results  Component Value Date   CHOL 208 (H) 07/02/2019   HDL 57.90 07/02/2019   LDLCALC 126 (H) 07/02/2019   LDLDIRECT 163.0 01/10/2017   TRIG 121.0 07/02/2019   CHOLHDL 4 07/02/2019     High hdl/ldl ratio favorable, continue diet /exercise

## 2019-07-03 NOTE — Assessment & Plan Note (Signed)
-   started bystolic 10 mg daily [id number] >>> reduced to 5 mg daily as of 05/17/17   Adequate control on present rx, reviewed in detail with pt > no change in rx needed    Lab Results  Component Value Date   CREATININE 0.94 07/02/2019   CREATININE 0.90 02/14/2018   CREATININE 0.95 01/10/2017

## 2019-07-03 NOTE — Assessment & Plan Note (Signed)
Onset early 2021  - 07/03/2019   rec avoid etoh, take multivit and refer to neuro if not improving  Over several months  Note no back pain, nl MCV, not diabetic and no findings on exam so try conservative approach first

## 2019-07-06 ENCOUNTER — Encounter: Payer: Managed Care, Other (non HMO) | Admitting: Internal Medicine

## 2019-07-06 MED ORDER — NEBIVOLOL HCL 5 MG PO TABS: 5.0000 mg | ORAL_TABLET | Freq: Every day | ORAL | 3 refills | Status: DC

## 2019-07-13 LAB — NICOTINE/COTININE METABOLITES
Cotinine: <1 ng/mL
Nicotine: <1 ng/mL

## 2019-07-14 NOTE — Progress Notes (Signed)
Patient identification verified. Recent lab results reviewed. Per Dr. Melvyn Novas, urinalysis, CBC with diff, BMET, hepatic function panel, lipid panel, PSA, TSH, and nicotine/cotinine metabolites are all unremarkable. There are no changes in recommendations. Patient verbalized understanding of results.

## 2020-01-15 NOTE — Telephone Encounter (Signed)
Dr.Wert,  Can you please advise.    Dr. Melvyn Novas,  I spoke with a Neurology office about my "burning" feet issue and they suggested that I have my Vitamin B6, B12 & D checked along with a Thyroid panel (TSH, T4 etc.). Their recommendation was that I supplement my Vit. B12 with cyanocobalamin injections (66ml QW for 1 month, then 56ml QM). Would you be agreeable to order these labs and the injections for me? Thank you.

## 2020-03-25 MED ORDER — BISOPROLOL-HYDROCHLOROTHIAZIDE 2.5-6.25 MG PO TABS: 1.0000 | ORAL_TABLET | Freq: Every day | ORAL | 11 refills | Status: DC

## 2020-03-25 NOTE — Telephone Encounter (Signed)
Dr. Melvyn Novas please advise on patient mychart message  Dr. Melvyn Novas,   My pharmacy is recommending that I switch my Bystolic to a generic from [facility]. Do you have any thoughts or concerns about this?  Thanks,

## 2020-03-25 NOTE — Telephone Encounter (Signed)
I submitted ziac for the bystolic - it's not the exact same so if bp not optimal will need ov to fine tune - I only did one month locally but if works ok can do the 3 month before next refill

## 2020-12-05 NOTE — Telephone Encounter (Signed)
MW please advise. Thanks  [name] Dr. Melvyn Novas,   I received a positive molecular Covid test this afternoon. Symptoms started last night about 10:00pm with drainage and coughing followed by a fever of 100 around 12:00pm today.   I have no idea where or when I was exposed, but I am concerned that we kept my 60 month old grandson all weekend.   Will you please share your recommendations for potential treatments and isolation guidelines?   Thank you.  Louis Lamb

## 2021-03-30 ENCOUNTER — Other Ambulatory Visit: Payer: Self-pay | Admitting: Internal Medicine

## 2021-04-04 ENCOUNTER — Encounter: Payer: Self-pay | Admitting: Internal Medicine

## 2021-04-04 MED ORDER — BISOPROLOL-HYDROCHLOROTHIAZIDE 2.5-6.25 MG PO TABS: 1.0000 | ORAL_TABLET | Freq: Every day | ORAL | 0 refills | Status: DC

## 2021-04-27 ENCOUNTER — Other Ambulatory Visit: Payer: Self-pay | Admitting: Internal Medicine

## 2021-05-02 ENCOUNTER — Ambulatory Visit (INDEPENDENT_AMBULATORY_CARE_PROVIDER_SITE_OTHER): Payer: No Typology Code available for payment source | Admitting: Internal Medicine

## 2021-05-02 ENCOUNTER — Other Ambulatory Visit: Payer: Self-pay

## 2021-05-02 ENCOUNTER — Encounter: Payer: Self-pay | Admitting: Internal Medicine

## 2021-05-02 DIAGNOSIS — R200 Anesthesia of skin: Secondary | ICD-10-CM

## 2021-05-02 DIAGNOSIS — Z0000 Encounter for general adult medical examination without abnormal findings: Secondary | ICD-10-CM | POA: Diagnosis not present

## 2021-05-02 DIAGNOSIS — Z86010 Personal history of colonic polyps: Secondary | ICD-10-CM

## 2021-05-02 DIAGNOSIS — R202 Paresthesia of skin: Secondary | ICD-10-CM | POA: Diagnosis not present

## 2021-05-02 DIAGNOSIS — I10 Essential (primary) hypertension: Secondary | ICD-10-CM

## 2021-05-02 DIAGNOSIS — E7800 Pure hypercholesterolemia, unspecified: Secondary | ICD-10-CM

## 2021-05-02 NOTE — Assessment & Plan Note (Addendum)
Onset early 2018 under care of podiatrist previously  - 07/03/2019   rec avoid etoh, take multivit and refer to neuro if not improving  Over several months  No change ie no progression over the last year with symptoms 4/7 nights ? Related to prior hx of Raynaud's > have rec f/u by Internal medicine with ? Neuro or rheum eval this year?           Each maintenance medication was reviewed in detail including emphasizing most importantly the difference between maintenance and prns and under what circumstances the prns are to be triggered using an action plan format where appropriate.  Total time for H and P, chart review, counseling,  and generating customized AVS unique to this office visit / same day charting > 30 min

## 2021-05-02 NOTE — Patient Instructions (Addendum)
I will be referring you to [facility] for primary care   Centrum A to Z one daily and reduce alcohol as much as possible and if not better after several months call for neurology eval.  To get the most out of exercise, you need to be continuously aware that you are short of breath, but never out of breath, for 30 minutes at least 3 x weekly

## 2021-05-02 NOTE — Progress Notes (Signed)
Subjective:    Patient ID: Louis Lamb, male    DOB: January 04, 1961    MRN: [id number]    Brief patient profile:  61   yowm never smoker with borderline hbp/ situational    History of Present Illness  01/11/2017  [facility] office visit/ Emiya Loomer  To re-establish for Primary care - last seen 2014  Chief Complaint  Patient presents with   Annual Exam    Labs done on 01/10/17. Pt states that he has noticed burning and pain in his feet off and on for the past 6-8 months.   planning to meet father next week in [place]/ so far no surprises in fm hx but plans to get more info soon  No regular aerobics - only new problem is intermittent numbness bottoms of both feet L > R worse at night / no pain/ already under the care of podiatrist but hasn't mentioned this as not seen in 6-8 m since onset, no real progression since then  rec Bystolic 10 mg daily  Td and pneumovax and flu shots today    07/03/2019  f/u ov/Kilynn Fitzsimmons re: hbp / new paresthesias bottoms of both feet x months  Chief Complaint  Patient presents with   Annual Exam    Labs done 07/02/2019. Pt c/o feet burning at night off and on most nights.    Dyspnea:  Walking 2-5 / day Cough: no Sleeping: anxiety SABA use: none 02: none Rec Centrum A to Z one daily and reduce alcohol as much as possible and if not better after several months call for neurology eval. To get the most out of exercise, you need to be continuously aware that you are short of breath, but never out of breath, for 30 minutes at least 3 x weekly          05/02/2021  f/u ov/Brayah Urquilla re: HBP/ paresthesias maint on norvasc 5 mg daily / says Raynaud's x lifetime if hands get cold  Chief Complaint  Patient presents with   Annual Exam    He c/o tingling in feet at night- started approx 2 years ago.   Paresthesisas bottoms of feet only x sev years s migration  Dyspnea:  Not limited by breathing from desired activities   Cough: none  Sleeping: none  SABA use: none  02:  none  Covid 19 - vax x 4  Positional R post cp also flares with bm's,  denies straining or constipation with neg colonoscopy 2018 Carlean Purl)      No obvious day to day or daytime variability or assoc excess/ purulent sputum or mucus plugs or hemoptysis or  chest tightness, subjective wheeze or overt sinus or hb symptoms.   Sleeping  without nocturnal  or early am exacerbation  of respiratory  c/o's or need for noct saba. Also denies any obvious fluctuation of symptoms with weather or environmental changes or other aggravating or alleviating factors except as outlined above   No unusual exposure hx or h/o childhood pna/ asthma or knowledge of premature birth.  Current Allergies, Complete Past Medical History, Past Surgical History, Family History, and Social History were reviewed in [employer] record.  ROS  The following are not active complaints unless bolded Hoarseness, sore throat, dysphagia, dental problems, itching, sneezing,  nasal congestion or discharge of excess mucus or purulent secretions, ear ache,   fever, chills, sweats, unintended wt loss or wt gain, classically pleuritic or exertional cp,  orthopnea pnd or arm/hand swelling  or leg  swelling, presyncope, palpitations, abdominal pain, anorexia, nausea, vomiting, diarrhea  or change in bowel habits or change in bladder habits, change in stools or change in urine, dysuria, hematuria,  rash, arthralgias, visual complaints, headache, numbness, weakness or ataxia or problems with walking or coordination,  change in mood or  memory.        Current Meds  Medication Sig   Amphetamine ER 12.5 MG TBED Take 1 tablet by mouth daily.   aspirin 81 MG tablet Take 81 mg by mouth daily.     bisoprolol-hydrochlorothiazide (ZIAC) 2.5-6.25 MG tablet TAKE ONE TABLET BY MOUTH DAILY            Past Medical History:  Raynaud's    Around 2010 or earlier  ADD   .................................................................   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)  RBBB  OBESITY (ICD-278.00)  Ideal wt 186  ALLERGIC RHINITIS (ICD-477.9)  - Allergy profile September 28, 2009 >>>  IgE 7.7  -  No allergens identified Health Maintenance...........................................Marland Kitchen  referred to [facility]  05/02/2021  - Td 01/11/2017  - Pneumovax 11/10/04  And 01/11/2017      Family History:  Adopted  Found biologic father in 2017  / had tonsil ca (smoker) / pat grandfather dz  Mother high chol but no premature dz / Mat grandfather cabg twice and died during his second at age 19       Social History:  Married  Children- 2 daughters  Medical supplies salesman  Never smoker  ETOH  [place] q night        Objective:   Physical Exam      05/02/2021        184  07/03/2019        195  02/17/2018        195  10/28/2017        191  04/22/2017          193   01/11/2017       194   09/04/12 191 lb (86.637 kg)  08/17/11 189 lb (85.73 kg)  07/20/11 192 lb 9.6 oz (87.363 kg)     Vital signs reviewed  05/02/2021  - Note at rest 02 sats  97% on RA   General appearance:    amb wm nad/ bp 118/70   HEENT : pt wearing mask not removed for exam due to covid -19 concerns.    NECK :  without JVD/Nodes/TM/ nl carotid upstrokes bilaterally   LUNGS: no acc muscle use,  Nl contour chest which is clear to A and P bilaterally without cough on insp or exp maneuvers   CV:  RRR  no s3 or murmur or increase in P2, and no edema   ABD:  soft and nontender with nl inspiratory excursion in the supine position. No bruits or organomegaly appreciated, bowel sounds nl  MS:  Nl gait/ ext warm without deformities, calf tenderness, cyanosis or clubbing No obvious joint restrictions   SKIN: warm and dry without lesions    NEURO:  alert, approp, nl sensorium with  no motor or cerebellar deficits apparent.     Labs ordered/ reviewed:      Chemistry      Component Value Date/Time   NA 139 05/03/2021 1044   K 4.4 05/03/2021 1044   CL 102  05/03/2021 1044   CO2 29 05/03/2021 1044   BUN 15 05/03/2021 1044   CREATININE 0.90 05/03/2021 1044      Component Value Date/Time   CALCIUM 9.7 05/03/2021 1044  ALKPHOS 52 05/03/2021 1044   AST 19 05/03/2021 1044   ALT 20 05/03/2021 1044   BILITOT 0.5 05/03/2021 1044        Lab Results  Component Value Date   WBC 4.8 05/03/2021   HGB 13.9 05/03/2021   HCT 41.7 05/03/2021   MCV 86.6 05/03/2021   PLT 301.0 05/03/2021     No results found for: DDIMER    Lab Results  Component Value Date   TSH 1.34 05/03/2021               Assessment & Plan:

## 2021-05-02 NOTE — Assessment & Plan Note (Addendum)
Referred to [facility] for PCP

## 2021-05-02 NOTE — Assessment & Plan Note (Addendum)
°  Lab Results  Component Value Date   CREATININE 0.90 05/03/2021   CREATININE 0.94 07/02/2019   CREATININE 0.90 02/14/2018     Adequate control on present rx, reviewed in detail with pt > no change in rx needed  = ziac 2.5/6.25 daily > refer to [facility] for PCP

## 2021-05-03 ENCOUNTER — Other Ambulatory Visit: Payer: Self-pay | Admitting: Internal Medicine

## 2021-05-03 ENCOUNTER — Other Ambulatory Visit (INDEPENDENT_AMBULATORY_CARE_PROVIDER_SITE_OTHER): Payer: No Typology Code available for payment source

## 2021-05-03 ENCOUNTER — Encounter: Payer: Self-pay | Admitting: Internal Medicine

## 2021-05-03 DIAGNOSIS — Z0000 Encounter for general adult medical examination without abnormal findings: Secondary | ICD-10-CM | POA: Diagnosis not present

## 2021-05-03 LAB — CBC WITH DIFFERENTIAL/PLATELET
Basophils Absolute: 0 10*3/uL (ref 0.0–0.1)
Basophils Relative: 0.5 % (ref 0.0–3.0)
Eosinophils Absolute: 0.1 10*3/uL (ref 0.0–0.7)
Eosinophils Relative: 1.7 % (ref 0.0–5.0)
HCT: 41.7 % (ref 39.0–52.0)
Hemoglobin: 13.9 g/dL (ref 13.0–17.0)
Lymphocytes Relative: 26.2 % (ref 12.0–46.0)
Lymphs Abs: 1.3 10*3/uL (ref 0.7–4.0)
MCHC: 33.4 g/dL (ref 30.0–36.0)
MCV: 86.6 fl (ref 78.0–100.0)
Monocytes Absolute: 0.6 10*3/uL (ref 0.1–1.0)
Monocytes Relative: 12.2 % — ABNORMAL HIGH (ref 3.0–12.0)
Neutro Abs: 2.9 10*3/uL (ref 1.4–7.7)
Neutrophils Relative %: 59.4 % (ref 43.0–77.0)
Platelets: 301 10*3/uL (ref 150.0–400.0)
RBC: 4.81 Mil/uL (ref 4.22–5.81)
RDW: 12.6 % (ref 11.5–15.5)
WBC: 4.8 10*3/uL (ref 4.0–10.5)

## 2021-05-03 LAB — URINALYSIS
Bilirubin Urine: NEGATIVE
Hgb urine dipstick: NEGATIVE
Ketones, ur: NEGATIVE
Leukocytes,Ua: NEGATIVE
Nitrite: NEGATIVE
Specific Gravity, Urine: 1.02 (ref 1.000–1.030)
Total Protein, Urine: NEGATIVE
Urine Glucose: NEGATIVE
Urobilinogen, UA: 0.2 (ref 0.0–1.0)
pH: 6.5 (ref 5.0–8.0)

## 2021-05-03 LAB — LIPID PANEL
Cholesterol: 243 mg/dL — ABNORMAL HIGH (ref 0–200)
HDL: 55.7 mg/dL (ref >39.00)
LDL Cholesterol: 160 mg/dL — ABNORMAL HIGH (ref 0–99)
NonHDL: 187.12
Total CHOL/HDL Ratio: 4
Triglycerides: 135 mg/dL (ref 0.0–149.0)
VLDL: 27 mg/dL (ref 0.0–40.0)

## 2021-05-03 LAB — HEPATIC FUNCTION PANEL
ALT: 20 U/L (ref 0–53)
AST: 19 U/L (ref 0–37)
Albumin: 4.3 g/dL (ref 3.5–5.2)
Alkaline Phosphatase: 52 U/L (ref 39–117)
Bilirubin, Direct: 0 mg/dL (ref 0.0–0.3)
Total Bilirubin: 0.5 mg/dL (ref 0.2–1.2)
Total Protein: 7.3 g/dL (ref 6.0–8.3)

## 2021-05-03 LAB — BASIC METABOLIC PANEL
BUN: 15 mg/dL (ref 6–23)
CO2: 29 mEq/L (ref 19–32)
Calcium: 9.7 mg/dL (ref 8.4–10.5)
Chloride: 102 mEq/L (ref 96–112)
Creatinine, Ser: 0.9 mg/dL (ref 0.40–1.50)
GFR: 93.82 mL/min (ref >60.00)
Glucose, Bld: 105 mg/dL — ABNORMAL HIGH (ref 70–99)
Potassium: 4.4 mEq/L (ref 3.5–5.1)
Sodium: 139 mEq/L (ref 135–145)

## 2021-05-03 LAB — TSH: TSH: 1.34 u[IU]/mL (ref 0.35–5.50)

## 2021-05-05 NOTE — Progress Notes (Signed)
Spoke with pt and notified of results per Dr. Wert. Pt verbalized understanding and denied any questions. 

## 2021-05-06 LAB — NICOTINE/COTININE METABOLITES
Cotinine: <1 ng/mL
Nicotine: <1 ng/mL

## 2021-05-22 ENCOUNTER — Encounter: Payer: Self-pay | Admitting: Internal Medicine

## 2021-05-22 NOTE — Assessment & Plan Note (Addendum)
°  Lab Results  Component Value Date   HGB 13.9 05/03/2021   HGB 14.5 07/02/2019   HGB 14.4 02/14/2018    10/2012 - 2 sessile serrated adenomas and 1 tubular adenoma 03/15/2017 no polyps recall 2023    Not clear as to cause of positonal R post cp reproduced also with BM's but due to for GI f/u this year anyway and has had the problem x 2 years so ok to wait until colonoscopy to discuss with GI

## 2021-05-22 NOTE — Assessment & Plan Note (Signed)
Target LDL < 130 as Type A/  hbp  Lab Results  Component Value Date   CHOL 243 (H) 05/03/2021   HDL 55.70 05/03/2021   LDLCALC 160 (H) 05/03/2021   LDLDIRECT 163.0 01/10/2017   TRIG 135.0 05/03/2021   CHOLHDL 4 05/03/2021     HDL protective but needs to work on lower LDL with diet/ ex and f/u with PCP

## 2021-05-24 ENCOUNTER — Encounter: Payer: Self-pay | Admitting: Internal Medicine

## 2021-07-04 ENCOUNTER — Other Ambulatory Visit: Payer: Self-pay | Admitting: Internal Medicine

## 2021-07-28 ENCOUNTER — Ambulatory Visit: Payer: No Typology Code available for payment source | Admitting: Family Medicine

## 2021-08-08 ENCOUNTER — Encounter: Payer: Self-pay | Admitting: Family Medicine

## 2021-08-08 ENCOUNTER — Ambulatory Visit (INDEPENDENT_AMBULATORY_CARE_PROVIDER_SITE_OTHER): Payer: No Typology Code available for payment source

## 2021-08-08 ENCOUNTER — Ambulatory Visit (INDEPENDENT_AMBULATORY_CARE_PROVIDER_SITE_OTHER): Payer: No Typology Code available for payment source | Admitting: Family Medicine

## 2021-08-08 VITALS — BP 120/70 | HR 63 | Temp 97.4°F | Ht 72.0 in | Wt 188.0 lb

## 2021-08-08 DIAGNOSIS — I10 Essential (primary) hypertension: Secondary | ICD-10-CM | POA: Diagnosis not present

## 2021-08-08 DIAGNOSIS — M79672 Pain in left foot: Secondary | ICD-10-CM | POA: Diagnosis not present

## 2021-08-08 DIAGNOSIS — M2142 Flat foot [pes planus] (acquired), left foot: Secondary | ICD-10-CM

## 2021-08-08 DIAGNOSIS — G4700 Insomnia, unspecified: Secondary | ICD-10-CM

## 2021-08-08 DIAGNOSIS — G8929 Other chronic pain: Secondary | ICD-10-CM

## 2021-08-08 DIAGNOSIS — Z1211 Encounter for screening for malignant neoplasm of colon: Secondary | ICD-10-CM

## 2021-08-08 DIAGNOSIS — H9313 Tinnitus, bilateral: Secondary | ICD-10-CM

## 2021-08-08 DIAGNOSIS — F988 Other specified behavioral and emotional disorders with onset usually occurring in childhood and adolescence: Secondary | ICD-10-CM

## 2021-08-08 DIAGNOSIS — M545 Low back pain, unspecified: Secondary | ICD-10-CM

## 2021-08-08 DIAGNOSIS — E785 Hyperlipidemia, unspecified: Secondary | ICD-10-CM

## 2021-08-08 DIAGNOSIS — Z86010 Personal history of colonic polyps: Secondary | ICD-10-CM

## 2021-08-08 DIAGNOSIS — R202 Paresthesia of skin: Secondary | ICD-10-CM

## 2021-08-08 MED ORDER — ROSUVASTATIN CALCIUM 5 MG PO TABS: 5.0000 mg | ORAL_TABLET | Freq: Every day | ORAL | 3 refills | Status: DC

## 2021-08-08 NOTE — Assessment & Plan Note (Signed)
Left thigh, anterior ??disc compression with lower back pain, but contralateral side to back pain ?Mild, chronic and stable and stable at the moment ?Xray to look for disc compression  ?Cont to monitor ?

## 2021-08-08 NOTE — Assessment & Plan Note (Signed)
Chronic, stable, intermittant ?On zolpidem, follows w/ psych ?

## 2021-08-08 NOTE — Assessment & Plan Note (Signed)
Follows w/ psych ?Controlled on amphetamine 12.5 mg ?

## 2021-08-08 NOTE — Progress Notes (Signed)
? ?New Patient Office Visit ? ?Subjective   ? ?Patient ID: Louis Lamb, male    DOB: 11-Jul-1960  Age: 61 y.o. MRN: [id number] ? ?CC:  ?Chief Complaint  ?Patient presents with  ? Establish Care  ?  Np est care. CPE c/o back pain middle and upper back pain. Also foot and knee pain.  ? ? ?HPI ?Louis Lamb presents to establish care. ? ? ?Lower back pain, past 2 years.  Intermittent.  Triggers include exertion and twisting such as unloading the discharge or bending over.  It is located close to an area that is where the patient had a large lipoma excised.  This was done under anesthesia.  Is not associated any weakness does not radiate anywhere. ? ?Patient reports occasional anterior left thigh numbness.  This is just a slightly dulled sensation.  Is not painful does not radiate anywhere.   ? ?Aslo has Left knee pain, no known injury, follows w/ [facility], s/p steroid pack, much improved after this.  ? ?Left foot pain, plantar fasciitis, also follows with [facility], would like to get custom orthotics, has worn them for several years.  ? ?Has ADHD, controlled on adzenys 12.5 mg.  ? ?Has occasional tinnitis. Not bothering him at the moment.  ? ?Outpatient Encounter Medications as of 08/08/2021  ?Medication Sig  ? Amphetamine ER 12.5 MG TBED Take 1 tablet by mouth daily.  ? aspirin 81 MG tablet Take 81 mg by mouth daily.    ? bisoprolol-hydrochlorothiazide (ZIAC) 2.5-6.25 MG tablet TAKE ONE TABLET BY MOUTH DAILY  ? cholecalciferol (VITAMIN D3) 25 MCG (1000 UNIT) tablet   ? rosuvastatin (CRESTOR) 5 MG tablet Take 1 tablet (5 mg total) by mouth daily.  ? Turmeric 500 MG CAPS   ? zolpidem (AMBIEN) 10 MG tablet zolpidem 10 mg tablet  ? ?No facility-administered encounter medications on file as of 08/08/2021.  ? ? ?Past Medical History:  ?Diagnosis Date  ? Allergic rhinitis   ? Allergy   ? GERD (gastroesophageal reflux disease)   ? Hypertension   ? Nevus   ? NEVUS, 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  ? Followed as Primary  Care Patient/ [facility]/ Wert  - L thigh    ? Obesity   ? Personal history of colonic adenomas 10/20/2012  ? RBBB (right bundle branch block with left anterior fascicular block)   ? ? ?Past Surgical History:  ?Procedure Laterality Date  ? COLONOSCOPY    ? 2014 (polyps)  and 2018 (no polyps)  ? LASIK    ? LIPOMA EXCISION    ? On back  ? ? ?Family History  ?Adopted: Yes  ?Problem Relation Age of Onset  ? Arthritis Mother   ? Throat cancer Father   ?     smoked  ? Colon cancer Paternal Grandmother   ? Pancreatic cancer Neg Hx   ? Rectal cancer Neg Hx   ? Stomach cancer Neg Hx   ? ? ?Social History  ? ?Socioeconomic History  ? Marital status: Married  ?  Spouse name: Not on file  ? Number of children: 2  ? Years of education: Not on file  ? Highest education level: Not on file  ?Occupational History  ? Occupation: Medical supplies salesman  ?  Employer: [employer]  ?Tobacco Use  ? Smoking status: Never  ? Smokeless tobacco: Never  ?Vaping Use  ? Vaping Use: Never used  ?Substance and Sexual Activity  ? Alcohol use: Yes  ?  Alcohol/week: 4.0  standard drinks  ?  Types: 4 Cans of beer per week  ? Drug use: No  ? Sexual activity: Yes  ?Other Topics Concern  ? Not on file  ?Social History Narrative  ? Not on file  ? ?Social Determinants of Health  ? ?Financial Resource Strain: Not on file  ?Food Insecurity: Not on file  ?Transportation Needs: Not on file  ?Physical Activity: Not on file  ?Stress: Not on file  ?Social Connections: Not on file  ?Intimate Partner Violence: Not on file  ? ? ?ROS ? ?  ? ? ?Objective   ? ?BP 120/70 (BP Location: Left Arm, Patient Position: Sitting, Cuff Size: Normal)   Pulse 63   Temp (!) 97.4 ?F (36.3 ?C) (Temporal)   Ht 6' (1.829 m)   Wt 188 lb (85.3 kg)   SpO2 98%   BMI 25.50 kg/m?  ? ?Gen NAD, resting comfortably ?CV: RRR with no murmurs appreciated ?Pulm: NWOB, CTAB with no crackles, wheezes, or rhonchi ?GI: Normal bowel sounds present. Soft, Nontender, Nondistended. ?MSK: no  edema, cyanosis, or clubbing noted, no tenderness along back, no lower extremely edema ?Skin: warm, dry ?Neuro: grossly normal, moves all extremities, normal gate ?Psych: Normal affect and thought content ? ?  ? ?Assessment & Plan:  ?The 10-year ASCVD risk score (Arnett DK, et al., 2019) is: 9.2% ?  Values used to calculate the score: ?    Age: 57 years ?    Sex: Male ?    Is Non-Hispanic African American: No ?    Diabetic: No ?    Tobacco smoker: No ?    Systolic Blood Pressure: 277 mmHg ?    Is BP treated: Yes ?    HDL Cholesterol: 55.7 mg/dL ?    Total Cholesterol: 243 mg/dL ? ?Problem List Items Addressed This Visit   ? ?  ? Cardiovascular and Mediastinum  ? Essential hypertension, benign  ?  Well controlled,  ? ?Continue ziac 2.5/6.25 ? ? ? ?  ?  ? Relevant Medications  ? rosuvastatin (CRESTOR) 5 MG tablet  ?  ? Other  ? Attention deficit disorder  ?  Follows w/ psych ?Controlled on amphetamine 12.5 mg ? ?  ?  ? Hyperlipidemia  ?  - start rosuvastatin 5 mg ?- encourage diet and exercise ?- recheck in 6 months ? ?  ?  ? Relevant Medications  ? rosuvastatin (CRESTOR) 5 MG tablet  ? Personal history of colonic adenomas  ?  Refer for colonscopy ? ?  ?  ? Paresthesia  ?  Left thigh, anterior ??disc compression with lower back pain, but contralateral side to back pain ?Mild, chronic and stable and stable at the moment ?Xray to look for disc compression  ?Cont to monitor ? ?  ?  ? Pes planovalgus, acquired, left  ?  Associated with foot pain ?- ordered orthotics, unsure if order will go through ?- if resistance, will refer to [facility] where he already follows ? ?  ?  ? Tinnitus of both ears  ?  Discussed pros/cons of intervention ?Pt declined medication or referral at this time ?Provided reassurance, cont to monitor ? ?  ?  ? Chronic right-sided low back pain - Primary  ?  Possibly related to previous lipoma removal, but sounds more likely related to MSK ?- xray ?- OTC nsaids prn ? ?  ?  ? Insomnia  ?  Chronic,  stable, intermittant ?On zolpidem, follows w/ psych ? ?  ?  ? ?  Other Visit Diagnoses   ? ? Screen for colon cancer      ? Relevant Orders  ? Ambulatory referral to Gastroenterology  ? ?  ? ? ?Return in about 6 months (around 02/07/2022) for BP.  ? ?Bonnita Hollow, MD ? ? ?

## 2021-08-08 NOTE — Assessment & Plan Note (Signed)
Well controlled,  ? ?Continue ziac 2.5/6.25 ? ? ?

## 2021-08-08 NOTE — Assessment & Plan Note (Signed)
Discussed pros/cons of intervention ?Pt declined medication or referral at this time ?Provided reassurance, cont to monitor ?

## 2021-08-08 NOTE — Assessment & Plan Note (Signed)
-   start rosuvastatin 5 mg ?- encourage diet and exercise ?- recheck in 6 months ?

## 2021-08-08 NOTE — Assessment & Plan Note (Signed)
Associated with foot pain ?- ordered orthotics, unsure if order will go through ?- if resistance, will refer to [facility] where he already follows ?

## 2021-08-08 NOTE — Assessment & Plan Note (Signed)
Refer for colonscopy ?

## 2021-08-08 NOTE — Patient Instructions (Signed)
Is a pleasure meeting today. ?We are getting a back x-ray today.  We also referring to GI for colonoscopy. ?We are starting rosuvastatin for cholesterol. ?Please come back in 6 months to recheck blood pressure and cholesterol labs. ?

## 2021-08-08 NOTE — Assessment & Plan Note (Signed)
Possibly related to previous lipoma removal, but sounds more likely related to MSK ?- xray ?- OTC nsaids prn ?

## 2021-08-08 NOTE — Assessment & Plan Note (Signed)
>>  ASSESSMENT AND PLAN FOR CHRONIC RIGHT-SIDED BACK PAIN WRITTEN ON 08/08/2021  3:51 PM BY Fanny Bien B, MD  Possibly related to previous lipoma removal, but sounds more likely related to MSK - xray - OTC nsaids prn

## 2021-08-14 ENCOUNTER — Other Ambulatory Visit: Payer: Self-pay | Admitting: Family Medicine

## 2021-08-14 DIAGNOSIS — M47816 Spondylosis without myelopathy or radiculopathy, lumbar region: Secondary | ICD-10-CM

## 2021-08-14 DIAGNOSIS — M4306 Spondylolysis, lumbar region: Secondary | ICD-10-CM

## 2021-08-14 DIAGNOSIS — M545 Low back pain, unspecified: Secondary | ICD-10-CM

## 2021-08-15 ENCOUNTER — Encounter: Payer: Self-pay | Admitting: Family Medicine

## 2021-08-22 ENCOUNTER — Telehealth: Payer: Self-pay | Admitting: Family Medicine

## 2021-08-22 NOTE — Telephone Encounter (Signed)
Pt is wanting his MR Lumbar Spine Wo Contrast (Order [id number]) sent to [facility] [phone]. ?

## 2021-08-24 ENCOUNTER — Other Ambulatory Visit: Payer: Self-pay | Admitting: Internal Medicine

## 2021-08-25 NOTE — Telephone Encounter (Signed)
FYI: Pt called wondering about the referral, I told him we are still waiting on the prior auth from his insurance ([employer]).  ? ?

## 2021-08-29 NOTE — Telephone Encounter (Signed)
Pt would like the referral be sent to [facility], not [facility].  ?The fax # is [phone]  ?Or [email or web address] ?

## 2021-08-30 NOTE — Telephone Encounter (Signed)
Pt states that [facility] said they would be able to do his imaging. He states that he has an appointment for his foot there and [facility] said they where able to do both his imagines on the day of is appointment. ?

## 2021-08-30 NOTE — Telephone Encounter (Signed)
Spoke with Pt and he decided to go with [facility] for his referral. ?

## 2021-08-31 ENCOUNTER — Telehealth: Payer: Self-pay | Admitting: Family Medicine

## 2021-08-31 ENCOUNTER — Other Ambulatory Visit: Payer: Self-pay | Admitting: Family Medicine

## 2021-08-31 DIAGNOSIS — M4306 Spondylolysis, lumbar region: Secondary | ICD-10-CM

## 2021-08-31 NOTE — Telephone Encounter (Signed)
Louis Lamb from [facility] needs authorization for the procedure. His appt is on Saturday so as quickly as possible. Fax # [phone]

## 2021-09-01 ENCOUNTER — Ambulatory Visit
Admission: RE | Admit: 2021-09-01 | Discharge: 2021-09-01 | Disposition: A | Discharge status: Home / Self Care | Payer: No Typology Code available for payment source | Source: Ambulatory Visit | Attending: Family Medicine | Admitting: Family Medicine

## 2021-09-01 DIAGNOSIS — M4306 Spondylolysis, lumbar region: Secondary | ICD-10-CM

## 2021-09-02 ENCOUNTER — Other Ambulatory Visit: Payer: No Typology Code available for payment source

## 2021-09-14 ENCOUNTER — Encounter: Payer: Self-pay | Admitting: Family Medicine

## 2021-09-26 ENCOUNTER — Other Ambulatory Visit: Payer: Self-pay | Admitting: Internal Medicine

## 2021-09-26 ENCOUNTER — Encounter: Payer: Self-pay | Admitting: Family Medicine

## 2021-10-25 ENCOUNTER — Other Ambulatory Visit: Payer: Self-pay | Admitting: Internal Medicine

## 2021-10-25 ENCOUNTER — Encounter: Payer: Self-pay | Admitting: Family Medicine

## 2021-10-25 ENCOUNTER — Telehealth: Payer: Self-pay | Admitting: Internal Medicine

## 2021-10-26 MED ORDER — BISOPROLOL-HYDROCHLOROTHIAZIDE 2.5-6.25 MG PO TABS: 1.0000 | ORAL_TABLET | Freq: Every day | ORAL | 0 refills | Status: DC

## 2021-11-03 ENCOUNTER — Telehealth: Payer: No Typology Code available for payment source | Admitting: Family Medicine

## 2021-11-03 DIAGNOSIS — F1090 Alcohol use, unspecified, uncomplicated: Secondary | ICD-10-CM | POA: Diagnosis not present

## 2021-11-03 DIAGNOSIS — R42 Dizziness and giddiness: Secondary | ICD-10-CM | POA: Diagnosis not present

## 2021-11-03 DIAGNOSIS — F988 Other specified behavioral and emotional disorders with onset usually occurring in childhood and adolescence: Secondary | ICD-10-CM | POA: Diagnosis not present

## 2021-11-03 DIAGNOSIS — I10 Essential (primary) hypertension: Secondary | ICD-10-CM | POA: Diagnosis not present

## 2021-11-03 NOTE — Patient Instructions (Signed)
Hypertension, Adult High blood pressure (hypertension) is when the force of blood pumping through the arteries is too strong. The arteries are the blood vessels that carry blood from the heart throughout the body. Hypertension forces the heart to work harder to pump blood and may cause arteries to become narrow or stiff. Untreated or uncontrolled hypertension can lead to a heart attack, heart failure, a stroke, kidney disease, and other problems. A blood pressure reading consists of a higher number over a lower number. Ideally, your blood pressure should be below 120/80. The first ("top") number is called the systolic pressure. It is a measure of the pressure in your arteries as your heart beats. The second ("bottom") number is called the diastolic pressure. It is a measure of the pressure in your arteries as the heart relaxes. What are the causes? The exact cause of this condition is not known. There are some conditions that result in high blood pressure. What increases the risk? Certain factors may make you more likely to develop high blood pressure. Some of these risk factors are under your control, including: Smoking. Not getting enough exercise or physical activity. Being overweight. Having too much fat, sugar, calories, or salt (sodium) in your diet. Drinking too much alcohol. Other risk factors include: Having a personal history of heart disease, diabetes, high cholesterol, or kidney disease. Stress. Having a family history of high blood pressure and high cholesterol. Having obstructive sleep apnea. Age. The risk increases with age. What are the signs or symptoms? High blood pressure may not cause symptoms. Very high blood pressure (hypertensive crisis) may cause: Headache. Fast or irregular heartbeats (palpitations). Shortness of breath. Nosebleed. Nausea and vomiting. Vision changes. Severe chest pain, dizziness, and seizures. How is this diagnosed? This condition is diagnosed by  measuring your blood pressure while you are seated, with your arm resting on a flat surface, your legs uncrossed, and your feet flat on the floor. The cuff of the blood pressure monitor will be placed directly against the skin of your upper arm at the level of your heart. Blood pressure should be measured at least twice using the same arm. Certain conditions can cause a difference in blood pressure between your right and left arms. If you have a high blood pressure reading during one visit or you have normal blood pressure with other risk factors, you may be asked to: Return on a different day to have your blood pressure checked again. Monitor your blood pressure at home for 1 week or longer. If you are diagnosed with hypertension, you may have other blood or imaging tests to help your health care provider understand your overall risk for other conditions. How is this treated? This condition is treated by making healthy lifestyle changes, such as eating healthy foods, exercising more, and reducing your alcohol intake. You may be referred for counseling on a healthy diet and physical activity. Your health care provider may prescribe medicine if lifestyle changes are not enough to get your blood pressure under control and if: Your systolic blood pressure is above 130. Your diastolic blood pressure is above 80. Your personal target blood pressure may vary depending on your medical conditions, your age, and other factors. Follow these instructions at home: Eating and drinking  Eat a diet that is high in fiber and potassium, and low in sodium, added sugar, and fat. An example of this eating plan is called the DASH diet. DASH stands for Dietary Approaches to Stop Hypertension. To eat this way: Eat   plenty of fresh fruits and vegetables. Try to fill one half of your plate at each meal with fruits and vegetables. Eat whole grains, such as whole-wheat pasta, brown rice, or whole-grain bread. Fill about one  fourth of your plate with whole grains. Eat or drink low-fat dairy products, such as skim milk or low-fat yogurt. Avoid fatty cuts of meat, processed or cured meats, and poultry with skin. Fill about one fourth of your plate with lean proteins, such as fish, chicken without skin, beans, eggs, or tofu. Avoid pre-made and processed foods. These tend to be higher in sodium, added sugar, and fat. Reduce your daily sodium intake. Many people with hypertension should eat less than 1,500 mg of sodium a day. Do not drink alcohol if: Your health care provider tells you not to drink. You are pregnant, may be pregnant, or are planning to become pregnant. If you drink alcohol: Limit how much you have to: 0-1 drink a day for women. 0-2 drinks a day for men. Know how much alcohol is in your drink. In the [place] one drink equals one 12 oz bottle of beer (355 mL), one 5 oz glass of wine (148 mL), or one 1 oz glass of hard liquor (44 mL). Lifestyle  Work with your health care provider to maintain a healthy body weight or to lose weight. Ask what an ideal weight is for you. Get at least 30 minutes of exercise that causes your heart to beat faster (aerobic exercise) most days of the week. Activities may include walking, swimming, or biking. Include exercise to strengthen your muscles (resistance exercise), such as Pilates or lifting weights, as part of your weekly exercise routine. Try to do these types of exercises for 30 minutes at least 3 days a week. Do not use any products that contain nicotine or tobacco. These products include cigarettes, chewing tobacco, and vaping devices, such as e-cigarettes. If you need help quitting, ask your health care provider. Monitor your blood pressure at home as told by your health care provider. Keep all follow-up visits. This is important. Medicines Take over-the-counter and prescription medicines only as told by your health care provider. Follow directions carefully. Blood  pressure medicines must be taken as prescribed. Do not skip doses of blood pressure medicine. Doing this puts you at risk for problems and can make the medicine less effective. Ask your health care provider about side effects or reactions to medicines that you should watch for. Contact a health care provider if you: Think you are having a reaction to a medicine you are taking. Have headaches that keep coming back (recurring). Feel dizzy. Have swelling in your ankles. Have trouble with your vision. Get help right away if you: Develop a severe headache or confusion. Have unusual weakness or numbness. Feel faint. Have severe pain in your chest or abdomen. Vomit repeatedly. Have trouble breathing. These symptoms may be an emergency. Get help right away. Call 911. Do not wait to see if the symptoms will go away. Do not drive yourself to the hospital. Summary Hypertension is when the force of blood pumping through your arteries is too strong. If this condition is not controlled, it may put you at risk for serious complications. Your personal target blood pressure may vary depending on your medical conditions, your age, and other factors. For most people, a normal blood pressure is less than 120/80. Hypertension is treated with lifestyle changes, medicines, or a combination of both. Lifestyle changes include losing weight, eating a healthy,   low-sodium diet, exercising more, and limiting alcohol. This information is not intended to replace advice given to you by your health care provider. Make sure you discuss any questions you have with your health care provider. Document Revised: 02/07/2021 Document Reviewed: 02/07/2021 Elsevier Patient Education  2023 [employer]  

## 2021-11-03 NOTE — Progress Notes (Signed)
Virtual Visit Consent   Louis Lamb, you are scheduled for a virtual visit with a [facility] provider today. Just as with appointments in the office, your consent must be obtained to participate. Your consent will be active for this visit and any virtual visit you may have with one of our providers in the next 365 days. If you have a MyChart account, a copy of this consent can be sent to you electronically.  As this is a virtual visit, video technology does not allow for your provider to perform a traditional examination. This may limit your provider's ability to fully assess your condition. If your provider identifies any concerns that need to be evaluated in person or the need to arrange testing (such as labs, EKG, etc.), we will make arrangements to do so. Although advances in technology are sophisticated, we cannot ensure that it will always work on either your end or our end. If the connection with a video visit is poor, the visit may have to be switched to a telephone visit. With either a video or telephone visit, we are not always able to ensure that we have a secure connection.  By engaging in this virtual visit, you consent to the provision of healthcare and authorize for your insurance to be billed (if applicable) for the services provided during this visit. Depending on your insurance coverage, you may receive a charge related to this service.  I need to obtain your verbal consent now. Are you willing to proceed with your visit today? Louis Lamb has provided verbal consent on 11/03/2021 for a virtual visit (video or telephone). Dellia Nims, FNP  Date: 11/03/2021 11:03 AM  Virtual Visit via Video Note   I, Dellia Nims, connected with  Louis Lamb  ([id number], [birth year]) on 11/03/21 at 11:00 AM EDT by a video-enabled telemedicine application and verified that I am speaking with the correct person using two identifiers.  Location: Patient: Virtual Visit Location  Patient: Home Provider: Virtual Visit Location Provider: Home Office   I discussed the limitations of evaluation and management by telemedicine and the availability of in person appointments. The patient expressed understanding and agreed to proceed.    History of Present Illness: Louis Lamb is a 61 y.o. who identifies as a male who was assigned male at birth, and is being seen today for elevated bp. He is on BP meds and a stimulant. He reports drinking too much alcohol last night. He has been dizzy this am. His BP is around 150/80's. His father had a stroke last week so he is concerned. Denied chest pain, SOB, nausea, blurred vision. He has already taken his BP and ADD meds this am. He is hydrating. Marland Kitchen  HPI: HPI  Problems:  Patient Active Problem List   Diagnosis Date Noted   Pes planovalgus, acquired, left 08/08/2021   Tinnitus of both ears 08/08/2021   Chronic right-sided low back pain 08/08/2021   Insomnia 08/08/2021   Paresthesia 07/03/2019   Personal history of colonic adenomas 10/20/2012   Hyperlipidemia 08/17/2011   Essential hypertension, benign 07/23/2011   Attention deficit disorder 09/28/2009   Rhinitis, allergic 04/23/2007    Allergies: No Known Allergies Medications:  Current Outpatient Medications:    Amphetamine ER 12.5 MG TBED, Take 1 tablet by mouth daily., Disp: , Rfl:    aspirin 81 MG tablet, Take 81 mg by mouth daily.  , Disp: , Rfl:    bisoprolol-hydrochlorothiazide (ZIAC) 2.5-6.25 MG tablet, Take 1 tablet by  mouth daily., Disp: 90 tablet, Rfl: 0   cholecalciferol (VITAMIN D3) 25 MCG (1000 UNIT) tablet, , Disp: , Rfl:    rosuvastatin (CRESTOR) 5 MG tablet, Take 1 tablet (5 mg total) by mouth daily., Disp: 90 tablet, Rfl: 3   Turmeric 500 MG CAPS, , Disp: , Rfl:    zolpidem (AMBIEN) 10 MG tablet, zolpidem 10 mg tablet, Disp: , Rfl:   Observations/Objective: Patient is well-developed, well-nourished in no acute distress.  Resting comfortably  at home.   Head is normocephalic, atraumatic.  No labored breathing.  Speech is clear and coherent with logical content.  Patient is alert and oriented at baseline.    Assessment and Plan: 1. Dizziness  2. Attention deficit disorder, unspecified hyperactivity presence  3. Primary hypertension  4. Alcohol intake above recommended sensible limits  Push fluids, hold stimulant this weekend and call pcp Monday, ED or urgent care if sx worsen, rest and monitor. No signs of stroke at this time. Sx appear to be more due toHTN, alcohol intake and stimulants.   Follow Up Instructions: I discussed the assessment and treatment plan with the patient. The patient was provided an opportunity to ask questions and all were answered. The patient agreed with the plan and demonstrated an understanding of the instructions.  A copy of instructions were sent to the patient via MyChart unless otherwise noted below.     The patient was advised to call back or seek an in-person evaluation if the symptoms worsen or if the condition fails to improve as anticipated.  Time:  I spent 10 minutes with the patient via telehealth technology discussing the above problems/concerns.    Dellia Nims, FNP

## 2021-11-29 NOTE — Telephone Encounter (Signed)
Noted     Closing encounter

## 2022-01-13 ENCOUNTER — Other Ambulatory Visit: Payer: Self-pay | Admitting: Family Medicine

## 2022-01-25 ENCOUNTER — Encounter: Payer: Self-pay | Admitting: Family Medicine

## 2022-01-25 ENCOUNTER — Ambulatory Visit (INDEPENDENT_AMBULATORY_CARE_PROVIDER_SITE_OTHER): Payer: No Typology Code available for payment source | Admitting: Family Medicine

## 2022-01-25 VITALS — BP 134/78 | HR 58 | Temp 97.8°F | Ht 72.0 in | Wt 188.8 lb

## 2022-01-25 DIAGNOSIS — J189 Pneumonia, unspecified organism: Secondary | ICD-10-CM | POA: Diagnosis not present

## 2022-01-25 MED ORDER — CLARITHROMYCIN ER 500 MG PO TB24: 1000.0000 mg | ORAL_TABLET | Freq: Every day | ORAL | 0 refills | Status: AC

## 2022-01-25 NOTE — Progress Notes (Signed)
Established Patient Office Visit  Subjective   Patient ID: Louis Lamb, male    DOB: 06-25-1960  Age: 61 y.o. MRN: [id number]  Chief Complaint  Patient presents with   Cough    Cough x 2 weeks.     Cough Pertinent negatives include no chills, eye redness, fever, myalgias, rash, shortness of breath or wheezing.   reports a 10-day history of URI symptoms that started with postnasal drip and a mild cough.  Cough has persisted and is now productive of purulent phlegm.  He denies wheezing or difficulty breathing.  He has no asthma history.  He has never smoked.  He does have a history of prior pneumonias.  He denies fevers chills or malaise.    Review of Systems  Constitutional: Negative.  Negative for chills, fever and malaise/fatigue.  HENT: Negative.    Eyes:  Negative for blurred vision, discharge and redness.  Respiratory:  Positive for cough and sputum production. Negative for shortness of breath and wheezing.   Cardiovascular: Negative.   Gastrointestinal:  Negative for abdominal pain.  Genitourinary: Negative.   Musculoskeletal: Negative.  Negative for myalgias.  Skin:  Negative for rash.  Neurological:  Negative for tingling, loss of consciousness and weakness.  Endo/Heme/Allergies:  Negative for polydipsia.      Objective:     BP 134/78 (BP Location: Right Arm, Patient Position: Sitting, Cuff Size: Normal)   Pulse (!) 58   Temp 97.8 F (36.6 C) (Temporal)   Ht 6' (1.829 m)   Wt 188 lb 12.8 oz (85.6 kg)   SpO2 99%   BMI 25.61 kg/m    Physical Exam Constitutional:      General: He is not in acute distress.    Appearance: Normal appearance. He is not ill-appearing, toxic-appearing or diaphoretic.  HENT:     Head: Normocephalic and atraumatic.     Right Ear: External ear normal.     Left Ear: External ear normal.     Mouth/Throat:     Mouth: Mucous membranes are moist.     Pharynx: Oropharynx is clear. No oropharyngeal exudate or posterior  oropharyngeal erythema.  Eyes:     General: No scleral icterus.       Right eye: No discharge.        Left eye: No discharge.     Extraocular Movements: Extraocular movements intact.     Conjunctiva/sclera: Conjunctivae normal.     Pupils: Pupils are equal, round, and reactive to light.  Cardiovascular:     Rate and Rhythm: Normal rate and regular rhythm.  Pulmonary:     Effort: Pulmonary effort is normal. No respiratory distress.     Breath sounds: Normal air entry. No decreased air movement. Examination of the left-middle field reveals rales. Examination of the left-lower field reveals rales. Rales present. No wheezing.  Abdominal:     General: Bowel sounds are normal.     Tenderness: There is no abdominal tenderness. There is no guarding.  Musculoskeletal:     Cervical back: No rigidity or tenderness.  Skin:    General: Skin is warm and dry.  Neurological:     Mental Status: He is alert and oriented to person, place, and time.  Psychiatric:        Mood and Affect: Mood normal.        Behavior: Behavior normal.      No results found for any visits on 01/25/22.    The 10-year ASCVD risk score (Arnett DK, et  al., 2019) is: 11.1%    Assessment & Plan:   Problem List Items Addressed This Visit       Respiratory   Pneumonia of left lower lobe due to infectious organism - Primary   Relevant Medications   clarithromycin (BIAXIN XL) 500 MG 24 hr tablet   Other Relevant Orders   DG Chest 2 View   Return May use Mucinex DM.  Follow-up next week if not improving.  Return for chest x-ray.  Has follow-up scheduled with Dr. Grandville Silos on the 29th.  Information was given on pneumonia and Biaxin.  He will return in the next day or so for a chest x-ray.  Libby Maw, MD

## 2022-01-26 ENCOUNTER — Other Ambulatory Visit: Payer: No Typology Code available for payment source

## 2022-01-26 ENCOUNTER — Ambulatory Visit (INDEPENDENT_AMBULATORY_CARE_PROVIDER_SITE_OTHER): Payer: No Typology Code available for payment source

## 2022-01-26 DIAGNOSIS — J189 Pneumonia, unspecified organism: Secondary | ICD-10-CM | POA: Diagnosis not present

## 2022-01-26 NOTE — Progress Notes (Unsigned)
Initial visit for a productive cough. He was dx yesterday, 01/25/22 with pneumonia

## 2022-02-08 ENCOUNTER — Encounter: Payer: Self-pay | Admitting: Family Medicine

## 2022-02-08 ENCOUNTER — Ambulatory Visit (INDEPENDENT_AMBULATORY_CARE_PROVIDER_SITE_OTHER): Payer: No Typology Code available for payment source | Admitting: Family Medicine

## 2022-02-08 VITALS — BP 148/98 | HR 77 | Temp 97.0°F | Wt 185.4 lb

## 2022-02-08 DIAGNOSIS — M549 Dorsalgia, unspecified: Secondary | ICD-10-CM | POA: Diagnosis not present

## 2022-02-08 DIAGNOSIS — Z8481 Family history of carrier of genetic disease: Secondary | ICD-10-CM | POA: Diagnosis not present

## 2022-02-08 DIAGNOSIS — E785 Hyperlipidemia, unspecified: Secondary | ICD-10-CM

## 2022-02-08 DIAGNOSIS — R051 Acute cough: Secondary | ICD-10-CM | POA: Diagnosis not present

## 2022-02-08 DIAGNOSIS — G8929 Other chronic pain: Secondary | ICD-10-CM

## 2022-02-08 DIAGNOSIS — I10 Essential (primary) hypertension: Secondary | ICD-10-CM

## 2022-02-08 MED ORDER — BENZONATATE 100 MG PO CAPS: 100.0000 mg | ORAL_CAPSULE | Freq: Two times a day (BID) | ORAL | 0 refills | Status: DC | PRN

## 2022-02-08 MED ORDER — BISOPROLOL-HYDROCHLOROTHIAZIDE 5-6.25 MG PO TABS: 1.0000 | ORAL_TABLET | Freq: Every day | ORAL | 1 refills | Status: DC

## 2022-02-08 NOTE — Assessment & Plan Note (Signed)
Elevated Increase to Ziac 5/6.25

## 2022-02-08 NOTE — Assessment & Plan Note (Signed)
PSA normal 2021 Consider testing at follow-up visit when getting other lab work

## 2022-02-08 NOTE — Assessment & Plan Note (Signed)
Likely from recent URI Tessalon Perles

## 2022-02-08 NOTE — Progress Notes (Signed)
Assessment/Plan:   Problem List Items Addressed This Visit       Cardiovascular and Mediastinum   Essential hypertension, benign    Elevated Increase to Ziac 5/6.25        Relevant Medications   bisoprolol-hydrochlorothiazide (ZIAC) 5-6.25 MG tablet     Other   Hyperlipidemia    Continue rosuvastatin Follow-up fasting for repeat testing including lipid panel, and lipidemia per patient request      Relevant Medications   bisoprolol-hydrochlorothiazide (ZIAC) 5-6.25 MG tablet   Acute cough    Likely from recent URI Tessalon Perles      Relevant Medications   benzonatate (TESSALON) 100 MG capsule   Family history of BRCA gene positive    PSA normal 2021 Consider testing at follow-up visit when getting other lab work      Other Visit Diagnoses     Chronic right-sided back pain, unspecified back location    -  Primary   Relevant Orders   Ambulatory referral to Physical Therapy   Primary hypertension       Relevant Medications   bisoprolol-hydrochlorothiazide (ZIAC) 5-6.25 MG tablet          Subjective:  HPI:  Louis Lamb is a 61 y.o. male who has Attention deficit disorder; Rhinitis, allergic; Essential hypertension, benign; Hyperlipidemia; Personal history of colonic adenomas; Paresthesia; Pes planovalgus, acquired, left; Tinnitus of both ears; Chronic right-sided low back pain; Insomnia; Acute cough; and Family history of BRCA gene positive on their problem list..   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 Non fasting ) .  Cough with URI.  Patient with recent clinical evaluation for respiratory illness.  He is clinically assess for  pneumonia and trialed on Clarithromycin.  Chest x-ray however was negative for pulmonary infiltrates acute abnormalities.  He reports ongoing cough with difficulty breathing when active cough.  However he has no dyspnea or wheezing when not coughing.  Overall patient feels that he is improving.  He has no chest pain, fevers.  Tolerating p.o. well  Family history of BRCA.  Patient reports that sister recently diagnosed for breast cancer and has BRCA positive testing.  Hypertension, established problem,  BP Readings from Last 3 Encounters:  02/08/22 (!) 148/98  01/25/22 134/78  08/08/21 120/70   Home BP monitoring: 130s to 150s, over 80s Current Medications: Bisoprolol 2.5/HCTZ 6.25 mg, compliant without side effects. Interim History:   Chronic back pain.  Persistent right sided lower thoracic/mid lumbar pain.  It is located under area of incision for lipoma.  It is intermittent.  Is not worsening.  Recent MR imaging showed mild spondylolisthesis but remainder grossly normal.  Briefly discussed interventions including surgery, medications, physical therapy.  Patient averse to trying medications at this point.  He is interested in physical therapy.  Hyperlipidemia, established problem,  Current medication(s): Rosuvastatin 5 mg.  Compliant without side effects.  atient is interested in lipoprotein a testing.  And inquired about my understanding.  Discussed my limited understanding of including if she is to refine cardiovascular risk and high risk patients with evaluation by cardiology., but current limited use in general population and primary care setting.  ROS: No chest pain or shortness of breath. No myalgias.  Lab Results  Component Value Date   CHOL 243 (H) 05/03/2021   CHOL 208 (H) 07/02/2019   CHOL 223 (H) 02/14/2018   Lab Results  Component Value Date   HDL 55.70 05/03/2021   HDL 57.90 07/02/2019   HDL 50.10 02/14/2018   Lab Results  Component Value Date   LDLCALC 160 (H)  05/03/2021   LDLCALC 126 (H) 07/02/2019   LDLCALC 141 (H) 02/14/2018   Lab Results  Component Value Date   TRIG 135.0 05/03/2021   TRIG 121.0 07/02/2019   TRIG 163.0 (H) 02/14/2018   Lab Results  Component Value Date   CHOLHDL 4 05/03/2021   CHOLHDL 4 07/02/2019   CHOLHDL 4 02/14/2018   Lab Results  Component Value Date   LDLDIRECT 163.0 01/10/2017   LDLDIRECT 138.1 09/04/2012   LDLDIRECT 120.9 08/17/2011   The 10-year ASCVD risk score (Arnett DK, et al., 2019) is: 13.1%   Values used to calculate the score:     Age: 39 years     Sex: Male     Is Non-Hispanic African American: No     Diabetic: No     Tobacco smoker: No     Systolic Blood Pressure: 998 mmHg     Is BP treated: Yes     HDL Cholesterol: 55.7 mg/dL     Total Cholesterol: 243 mg/dL    Past Surgical History:  Procedure Laterality Date   COLONOSCOPY     2014 (polyps)  and 2018 (no polyps)   LASIK     LIPOMA EXCISION     On back    Outpatient Medications Prior to Visit  Medication Sig Dispense Refill   Amphetamine ER 12.5 MG TBED Take 1 tablet by mouth daily.     aspirin 81 MG tablet Take 81 mg by mouth daily.       cholecalciferol (VITAMIN D3) 25 MCG (1000 UNIT) tablet      rosuvastatin (CRESTOR) 5 MG tablet Take 1 tablet (5 mg total) by mouth daily. 90 tablet 3   Turmeric 500 MG CAPS      zolpidem (AMBIEN) 10 MG tablet zolpidem 10 mg tablet     bisoprolol-hydrochlorothiazide (ZIAC) 2.5-6.25 MG tablet TAKE ONE TABLET BY MOUTH DAILY 90 tablet 0   No facility-administered medications prior to visit.    Family History  Adopted: Yes  Problem Relation Age of Onset   Arthritis Mother    Throat cancer Father        smoked   Colon cancer Paternal Grandmother    Pancreatic cancer Neg Hx    Rectal cancer Neg Hx    Stomach cancer Neg Hx     Social History   Socioeconomic History   Marital status: Married    Spouse name: Not on file   Number of children: 2   Years of education: Not on file    Highest education level: Not on file  Occupational History   Occupation: Medical supplies salesman    Employer: [employer]  Tobacco Use   Smoking status: Never   Smokeless tobacco: Never  Vaping Use   Vaping Use: Never used  Substance and Sexual Activity   Alcohol use: Yes    Alcohol/week: 4.0 standard drinks of alcohol    Types: 4 Cans of beer per week   Drug use: No   Sexual activity: Yes  Other Topics Concern   Not on file  Social History Narrative   Not on file   Social Determinants of Health   Financial Resource  Strain: Not on file  Food Insecurity: Not on file  Transportation Needs: Not on file  Physical Activity: Not on file  Stress: Not on file  Social Connections: Not on file  Intimate Partner Violence: Not on file                                                                                                 Objective:  Physical Exam: BP (!) 148/98 (BP Location: Left Arm, Patient Position: Sitting, Cuff Size: Large)   Pulse 77   Temp (!) 97 F (36.1 C) (Temporal)   Wt 185 lb 6.4 oz (84.1 kg)   SpO2 98%   BMI 25.14 kg/m    General: No acute distress. Awake and conversant.  Eyes: Normal conjunctiva, anicteric. Round symmetric pupils.  ENT: Hearing grossly intact. No nasal discharge.  Neck: Neck is supple. No masses or thyromegaly.  Respiratory: Respirations are non-labored. No auditory wheezing.  CTA B Skin: Warm. No rashes or ulcers.  Psych: Alert and oriented. Cooperative, Appropriate mood and affect, Normal judgment.  CV: No cyanosis or JVD, RRR, no MRG MSK: Normal ambulation.  Mild right mid to lower back tenderness underlying superficial scar where patient had lipoma, no nodule or deep scar tissue palpated, the skin is mobile Neuro: Sensation and CN II-XII grossly normal.        Alesia Banda, MD, MS

## 2022-02-08 NOTE — Assessment & Plan Note (Signed)
Continue rosuvastatin Follow-up fasting for repeat testing including lipid panel, and lipidemia per patient request

## 2022-03-06 ENCOUNTER — Ambulatory Visit (INDEPENDENT_AMBULATORY_CARE_PROVIDER_SITE_OTHER): Payer: No Typology Code available for payment source | Admitting: Family Medicine

## 2022-03-06 ENCOUNTER — Other Ambulatory Visit: Payer: Self-pay | Admitting: Family Medicine

## 2022-03-06 ENCOUNTER — Encounter: Payer: Self-pay | Admitting: Family Medicine

## 2022-03-06 ENCOUNTER — Telehealth: Payer: Self-pay

## 2022-03-06 VITALS — BP 136/88 | HR 46 | Temp 97.8°F | Wt 185.4 lb

## 2022-03-06 DIAGNOSIS — Z8481 Family history of carrier of genetic disease: Secondary | ICD-10-CM

## 2022-03-06 DIAGNOSIS — E7800 Pure hypercholesterolemia, unspecified: Secondary | ICD-10-CM

## 2022-03-06 DIAGNOSIS — I10 Essential (primary) hypertension: Secondary | ICD-10-CM

## 2022-03-06 DIAGNOSIS — E785 Hyperlipidemia, unspecified: Secondary | ICD-10-CM

## 2022-03-06 NOTE — Progress Notes (Signed)
Encounter canceled

## 2022-03-06 NOTE — Telephone Encounter (Signed)
Contacted patient regarding scheduling his appointment

## 2022-03-07 NOTE — Therapy (Incomplete)
OUTPATIENT PHYSICAL THERAPY THORACOLUMBAR EVALUATION   Patient Name: Louis Lamb MRN: [id number] DOB:[birth year], 61 y.o., male Today's Date: 03/07/2022  END OF SESSION:   Past Medical History:  Diagnosis Date   Allergic rhinitis    Allergy    GERD (gastroesophageal reflux disease)    Hypertension    Nevus    NEVUS, 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   Followed as Primary Care Patient/ [facility]/ [facility]  - L thigh     Obesity    Personal history of colonic adenomas 10/20/2012   RBBB (right bundle branch block with left anterior fascicular block)    Past Surgical History:  Procedure Laterality Date   COLONOSCOPY     2014 (polyps)  and 2018 (no polyps)   LASIK     LIPOMA EXCISION     On back   Patient Active Problem List   Diagnosis Date Noted   Acute cough 02/08/2022   Family history of BRCA gene positive 02/08/2022   Pes planovalgus, acquired, left 08/08/2021   Tinnitus of both ears 08/08/2021   Chronic right-sided low back pain 08/08/2021   Insomnia 08/08/2021   Paresthesia 07/03/2019   Personal history of colonic adenomas 10/20/2012   Hyperlipidemia 08/17/2011   Essential hypertension, benign 07/23/2011   Attention deficit disorder 09/28/2009   Rhinitis, allergic 04/23/2007    PCP: Josephine Igo  REFERRING PROVIDER: Josephine Igo  REFERRING DIAG: V69.7,X48.01   Rationale for Evaluation and Treatment: Rehabilitation  THERAPY DIAG:  No diagnosis found.  ONSET DATE: 02/08/22  SUBJECTIVE:                                                                                                                                                                                           SUBJECTIVE STATEMENT: ***  PERTINENT HISTORY:  ***  PAIN:  Are you having pain? {OPRCPAIN:27236}  PRECAUTIONS: {Therapy precautions:24002}  WEIGHT BEARING RESTRICTIONS: {Yes ***/No:24003}  FALLS:  Has patient fallen in last 6 months? {fallsyesno:27318}  LIVING  ENVIRONMENT: Lives with: {OPRC lives with:25569::"lives with their family"} Lives in: {Lives in:25570} Stairs: {opstairs:27293} Has following equipment at home: {Assistive devices:23999}  OCCUPATION: ***  PLOF: {PLOF:24004}  PATIENT GOALS: ***  NEXT MD VISIT:   OBJECTIVE:   DIAGNOSTIC FINDINGS:  ***  PATIENT SURVEYS:  {rehab surveys:24030}  SCREENING FOR RED FLAGS: Bowel or bladder incontinence: {Yes/No:304960894} Spinal tumors: {Yes/No:304960894} Cauda equina syndrome: {Yes/No:304960894} Compression fracture: {Yes/No:304960894} Abdominal aneurysm: {Yes/No:304960894}  COGNITION: Overall cognitive status: {cognition:24006}     SENSATION: {sensation:27233}  MUSCLE LENGTH: Hamstrings: Right *** deg; Left *** deg Thomas test: Right *** deg; Left *** deg  POSTURE: {posture:25561}  PALPATION: ***  LUMBAR ROM:  AROM eval  Flexion   Extension   Right lateral flexion   Left lateral flexion   Right rotation   Left rotation    (Blank rows = not tested)  LOWER EXTREMITY ROM:     {AROM/PROM:27142}  Right eval Left eval  Hip flexion    Hip extension    Hip abduction    Hip adduction    Hip internal rotation    Hip external rotation    Knee flexion    Knee extension    Ankle dorsiflexion    Ankle plantarflexion    Ankle inversion    Ankle eversion     (Blank rows = not tested)  LOWER EXTREMITY MMT:    MMT Right eval Left eval  Hip flexion    Hip extension    Hip abduction    Hip adduction    Hip internal rotation    Hip external rotation    Knee flexion    Knee extension    Ankle dorsiflexion    Ankle plantarflexion    Ankle inversion    Ankle eversion     (Blank rows = not tested)  LUMBAR SPECIAL TESTS:  {lumbar special test:25242}  FUNCTIONAL TESTS:  {Functional tests:24029}  GAIT: Distance walked: *** Assistive device utilized: {Assistive devices:23999} Level of assistance: {Levels of assistance:24026} Comments:  ***  TODAY'S TREATMENT:                                                                                                                              DATE: ***    PATIENT EDUCATION:  Education details: *** Person educated: {Person educated:25204} Education method: {Education Method:25205} Education comprehension: {Education Comprehension:25206}  HOME EXERCISE PROGRAM: ***  ASSESSMENT:  CLINICAL IMPRESSION: Patient is a *** y.o. *** who was seen today for physical therapy evaluation and treatment for ***.   OBJECTIVE IMPAIRMENTS: {opptimpairments:25111}.   ACTIVITY LIMITATIONS: {activitylimitations:27494}  PARTICIPATION LIMITATIONS: {participationrestrictions:25113}  PERSONAL FACTORS: {Personal factors:25162} are also affecting patient's functional outcome.   REHAB POTENTIAL: {rehabpotential:25112}  CLINICAL DECISION MAKING: {clinical decision making:25114}  EVALUATION COMPLEXITY: {Evaluation complexity:25115}   GOALS: Goals reviewed with patient? {yes/no:20286}  SHORT TERM GOALS: Target date: ***  *** Baseline: Goal status: {GOALSTATUS:25110}  2.  *** Baseline:  Goal status: {GOALSTATUS:25110}  3.  *** Baseline:  Goal status: {GOALSTATUS:25110}  4.  *** Baseline:  Goal status: {GOALSTATUS:25110}  5.  *** Baseline:  Goal status: {GOALSTATUS:25110}  6.  *** Baseline:  Goal status: {GOALSTATUS:25110}  LONG TERM GOALS: Target date: ***  *** Baseline:  Goal status: {GOALSTATUS:25110}  2.  *** Baseline:  Goal status: {GOALSTATUS:25110}  3.  *** Baseline:  Goal status: {GOALSTATUS:25110}  4.  *** Baseline:  Goal status: {GOALSTATUS:25110}  5.  *** Baseline:  Goal status: {GOALSTATUS:25110}  6.  *** Baseline:  Goal status: {GOALSTATUS:25110}  PLAN:  PT FREQUENCY: {rehab frequency:25116}  PT DURATION: {rehab duration:25117}  PLANNED INTERVENTIONS: {rehab planned interventions:25118::"Therapeutic exercises","Therapeutic  activity","Neuromuscular re-education","Balance training","Gait training","Patient/Family  education","Self Care","Joint mobilization"}.  PLAN FOR NEXT SESSION: Andris Baumann, PT 03/07/2022, 10:43 AM

## 2022-03-12 ENCOUNTER — Ambulatory Visit: Payer: No Typology Code available for payment source

## 2022-03-15 ENCOUNTER — Encounter: Payer: Self-pay | Admitting: Internal Medicine

## 2022-03-19 NOTE — Therapy (Signed)
OUTPATIENT PHYSICAL THERAPY THORACOLUMBAR EVALUATION   Patient Name: Louis Lamb MRN: [id number] DOB:[birth year], 61 y.o., male Today's Date: 03/21/2022  END OF SESSION:  PT End of Session - 03/21/22 1657     Visit Number 1    Date for PT Re-Evaluation 05/30/22    Authorization Type [employer]    PT Start Time 1657    PT Stop Time 4585    PT Time Calculation (min) 49 min    Activity Tolerance Patient tolerated treatment well    Behavior During Therapy [facility] for tasks assessed/performed             Past Medical History:  Diagnosis Date   Allergic rhinitis    Allergy    GERD (gastroesophageal reflux disease)    Hypertension    Nevus    NEVUS, 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   Followed as Primary Care Patient/  Healthcare/ [facility]  - L thigh     Obesity    Personal history of colonic adenomas 10/20/2012   RBBB (right bundle branch block with left anterior fascicular block)    Past Surgical History:  Procedure Laterality Date   COLONOSCOPY     2014 (polyps)  and 2018 (no polyps)   LASIK     LIPOMA EXCISION     On back   Patient Active Problem List   Diagnosis Date Noted   Acute cough 02/08/2022   Family history of BRCA gene positive 02/08/2022   Pes planovalgus, acquired, left 08/08/2021   Tinnitus of both ears 08/08/2021   Chronic right-sided low back pain 08/08/2021   Insomnia 08/08/2021   Paresthesia 07/03/2019   Personal history of colonic adenomas 10/20/2012   Hyperlipidemia 08/17/2011   Essential hypertension, benign 07/23/2011   Attention deficit disorder 09/28/2009   Rhinitis, allergic 04/23/2007    PCP: Josephine Igo  REFERRING PROVIDER: Josephine Igo  REFERRING DIAG: F29.2,K46.28   Rationale for Evaluation and Treatment: Rehabilitation  THERAPY DIAG:  Pain in thoracic spine  ONSET DATE: 02/08/22  SUBJECTIVE:                                                                                                                                                                                            SUBJECTIVE STATEMENT: I am not sure you can help but I have an intermittent pain in my spine. There's 2 spots one closer to my spine that is sharp and one further out that is dull.   PERTINENT HISTORY:  GERD  PAIN:  Are you having pain? Yes: NPRS scale: 8/10 Pain location: thoracic spine and radiates out towards R scapula Pain description: sharp, shooting, dull Aggravating factors:  loading the dishwasher Relieving factors: nothing, it only lasts for a few seconds and goes away   PRECAUTIONS: None  WEIGHT BEARING RESTRICTIONS: No  FALLS:  Has patient fallen in last 6 months? No  LIVING ENVIRONMENT: Lives with: lives with their family Lives in: House/apartment Stairs: Yes: Internal: 16 steps; on right going up Has following equipment at home: None  OCCUPATION: Sell medical supplies  PLOF: Independent  PATIENT GOALS: to figure out what was going on   NEXT MD VISIT:   OBJECTIVE:   DIAGNOSTIC FINDINGS:  Disc spaces: Degenerative disease with disc height loss at L1-2. Disc desiccation at L5-S1.  L4-L5: Broad-based disc bulge with a broad shallow central disc protrusion. No foraminal or central canal stenosis.   L5-S1: Minimal broad-based disc bulge. Mild bilateral foraminal narrowing. No central canal narrowing.   IMPRESSION: 1. Mild lower lumbar spine spondylosis as described above. 2. No acute osseous injury of the lumbar spine.  SCREENING FOR RED FLAGS: Bowel or bladder incontinence: No Spinal tumors: No Cauda equina syndrome: No Compression fracture: No Abdominal aneurysm: No  COGNITION: Overall cognitive status: Within functional limits for tasks assessed     SENSATION: WFL  PALPATION: No TTP, muscle tightness in erector spinae and popping sensations with thoracic mobilizations to T-spine  LUMBAR ROM:   AROM eval  Flexion WFL tightness in HS  Extension WFL  Right lateral flexion WFL  Left lateral  flexion WFL  Right rotation WFL  Left rotation WFL   (Blank rows = not tested)  LOWER EXTREMITY ROM:   grossly WFL   LOWER EXTREMITY MMT:  grossly WFL    FUNCTIONAL TESTS:  5 times sit to stand: 9s    TODAY'S TREATMENT:                                                                                                                              DATE: 03/21/22- EVAL    PATIENT EDUCATION:  Education details: POC Person educated: Patient Education method: Explanation Education comprehension: verbalized understanding  HOME EXERCISE PROGRAM: TBD  ASSESSMENT:  CLINICAL IMPRESSION: Patient is a 61 y.o. male who was seen today for physical therapy evaluation and treatment for R sided thoracic pain. Patient is not convinced PT can benefit him in any way since there is no explanation for his pain. He has intermittent pain that only lasts a few seconds around T9 and radiates out about 2-3inches to the right. He pain was unable to be reproduced with any palpation or mobility testing. He does present with increased muscle tightness in thoracic paraspinals and had lots of pops with mobilizations. We tried dry needling to R erector spinae to see if that would help with his pain. Patient will return to PT if he feels that today's session helped him, otherwise he thinks he can get himself on a regular exercises routine that will suffice.   REHAB POTENTIAL: Fair    CLINICAL DECISION MAKING: Evolving/moderate complexity  EVALUATION COMPLEXITY: Moderate  GOALS: Goals reviewed with patient? No  SHORT TERM GOALS: Target date: 04/25/21  Patient will be independent with initial HEP.  Goal status: INITIAL   LONG TERM GOALS: Target date: 05/30/22  Patient will be independent with advanced/ongoing HEP to improve outcomes and carryover.  Goal status: INITIAL  2.  Patient will report no thoracic pain in a 4 week period.  Baseline: intermittent pain randomly Goal status: INITIAL  3.  Patient  will demonstrate decreased tightness in thoracic paraspinals.   Baseline: increased tightness and popping with thoracic mobilizations Goal status: INITIAL  PLAN:  PT FREQUENCY: 1x/week  PT DURATION: 10 weeks  PLANNED INTERVENTIONS: Therapeutic exercises, Therapeutic activity, Neuromuscular re-education, Balance training, Gait training, Patient/Family education, Self Care, Joint mobilization, Dry Needling, Electrical stimulation, Cryotherapy, Moist heat, Taping, Ionotophoresis 94m/ml Dexamethasone, and Manual therapy.  PLAN FOR NEXT SESSION: see how DN went, start some thoracic mobility and strength exercises   MAndris Baumann PT 03/21/2022, 6:01 PM

## 2022-03-21 ENCOUNTER — Ambulatory Visit: Payer: No Typology Code available for payment source | Attending: Family Medicine

## 2022-03-21 DIAGNOSIS — M549 Dorsalgia, unspecified: Secondary | ICD-10-CM | POA: Diagnosis not present

## 2022-03-21 DIAGNOSIS — G8929 Other chronic pain: Secondary | ICD-10-CM | POA: Insufficient documentation

## 2022-03-21 DIAGNOSIS — M546 Pain in thoracic spine: Secondary | ICD-10-CM

## 2022-03-21 DIAGNOSIS — M62830 Muscle spasm of back: Secondary | ICD-10-CM | POA: Diagnosis present

## 2022-07-30 ENCOUNTER — Other Ambulatory Visit: Payer: Self-pay | Admitting: Family Medicine

## 2022-07-30 DIAGNOSIS — I10 Essential (primary) hypertension: Secondary | ICD-10-CM

## 2022-07-30 DIAGNOSIS — E785 Hyperlipidemia, unspecified: Secondary | ICD-10-CM

## 2022-08-06 ENCOUNTER — Telehealth: Payer: Self-pay | Admitting: Family Medicine

## 2022-08-06 DIAGNOSIS — E785 Hyperlipidemia, unspecified: Secondary | ICD-10-CM

## 2022-08-06 DIAGNOSIS — I10 Essential (primary) hypertension: Secondary | ICD-10-CM

## 2022-08-06 MED ORDER — BISOPROLOL-HYDROCHLOROTHIAZIDE 5-6.25 MG PO TABS: 1.0000 | ORAL_TABLET | Freq: Every day | ORAL | 0 refills | Status: DC

## 2022-08-06 MED ORDER — ROSUVASTATIN CALCIUM 5 MG PO TABS: 5.0000 mg | ORAL_TABLET | Freq: Every day | ORAL | 0 refills | Status: DC

## 2022-08-06 NOTE — Telephone Encounter (Signed)
Chart supports rx. Last OV: 02/08/2022 Next OV: 08/21/2022

## 2022-08-06 NOTE — Telephone Encounter (Signed)
Prescription Request  08/06/2022  LOV: 02/08/2022  What is the name of the medication or equipment? bisoprolol-hydrochlorothiazide (ZIAC) 5-6.25 MG tablet [Pharmacy Med Name: BISOPROLOL/HCTZ 5-6.25MG  TAB] [[id number]] and rosuvastatin (CRESTOR) 5 MG tablet [Pharmacy Med Name: ROSUVASTATIN CALCIUM  TAB] [[id number]  Have you contacted your pharmacy to request a refill? Yes, he scheduled an app with Dr Janee Morn on 5/7  Which pharmacy would you like this sent to?  [facility] - [place], [place] - [address] [facility] [address] [place] [address] Phone: [phone] Fax: [phone]    Patient notified that their request is being sent to the clinical staff for review and that they should receive a response within 2 business days.   Please advise at Home [phone] (Work)

## 2022-08-08 ENCOUNTER — Other Ambulatory Visit (INDEPENDENT_AMBULATORY_CARE_PROVIDER_SITE_OTHER): Payer: No Typology Code available for payment source

## 2022-08-08 DIAGNOSIS — E7800 Pure hypercholesterolemia, unspecified: Secondary | ICD-10-CM | POA: Diagnosis not present

## 2022-08-08 DIAGNOSIS — I10 Essential (primary) hypertension: Secondary | ICD-10-CM | POA: Diagnosis not present

## 2022-08-08 LAB — COMPREHENSIVE METABOLIC PANEL
ALT: 43 U/L (ref 0–53)
AST: 38 U/L — ABNORMAL HIGH (ref 0–37)
Albumin: 4.4 g/dL (ref 3.5–5.2)
Alkaline Phosphatase: 55 U/L (ref 39–117)
BUN: 12 mg/dL (ref 6–23)
CO2: 28 mEq/L (ref 19–32)
Calcium: 10.1 mg/dL (ref 8.4–10.5)
Chloride: 103 mEq/L (ref 96–112)
Creatinine, Ser: 0.92 mg/dL (ref 0.40–1.50)
GFR: 90.57 mL/min (ref >60.00)
Glucose, Bld: 102 mg/dL — ABNORMAL HIGH (ref 70–99)
Potassium: 5.9 mEq/L — ABNORMAL HIGH (ref 3.5–5.1)
Sodium: 143 mEq/L (ref 135–145)
Total Bilirubin: 0.4 mg/dL (ref 0.2–1.2)
Total Protein: 7.2 g/dL (ref 6.0–8.3)

## 2022-08-08 LAB — LIPID PANEL
Cholesterol: 184 mg/dL (ref 0–200)
HDL: 65.2 mg/dL (ref >39.00)
LDL Cholesterol: 94 mg/dL (ref 0–99)
NonHDL: 118.3
Total CHOL/HDL Ratio: 3
Triglycerides: 120 mg/dL (ref 0.0–149.0)
VLDL: 24 mg/dL (ref 0.0–40.0)

## 2022-08-08 NOTE — Progress Notes (Signed)
Patient arrived for lab visit today and one of the labs , [facility] ([facility]), was unable to be released in our system. Tried searching for lab under [facility], [facility], or [facility]. Please advise. I advised patient that I would let you know and call him with update.

## 2022-08-10 NOTE — Addendum Note (Signed)
Addended by: Fanny Bien B on: 08/10/2022 01:22 PM   Modules accepted: Orders

## 2022-08-10 NOTE — Progress Notes (Signed)
Looked up medication on [facility] and it's a lavender top tube. We can draw it in house and send out to [facility]. Spoke with patient and advised him that we found an alternative test. He stated that he will wait to have it drawn at his OV on 5/7 with PCP.

## 2022-08-13 LAB — LIPOPROTEIN A (LPA): Lipoprotein (a): <10 nmol/L (ref <75)

## 2022-08-21 ENCOUNTER — Ambulatory Visit: Payer: No Typology Code available for payment source | Admitting: Family Medicine

## 2022-08-21 ENCOUNTER — Encounter: Payer: Self-pay | Admitting: Family Medicine

## 2022-08-21 VITALS — BP 118/78 | HR 77 | Temp 97.8°F | Wt 191.0 lb

## 2022-08-21 DIAGNOSIS — M546 Pain in thoracic spine: Secondary | ICD-10-CM | POA: Diagnosis not present

## 2022-08-21 DIAGNOSIS — G8929 Other chronic pain: Secondary | ICD-10-CM

## 2022-08-21 DIAGNOSIS — M25521 Pain in right elbow: Secondary | ICD-10-CM

## 2022-08-21 DIAGNOSIS — Z8481 Family history of carrier of genetic disease: Secondary | ICD-10-CM

## 2022-08-21 DIAGNOSIS — E875 Hyperkalemia: Secondary | ICD-10-CM

## 2022-08-21 DIAGNOSIS — Z125 Encounter for screening for malignant neoplasm of prostate: Secondary | ICD-10-CM

## 2022-08-21 DIAGNOSIS — I10 Essential (primary) hypertension: Secondary | ICD-10-CM | POA: Diagnosis not present

## 2022-08-21 DIAGNOSIS — R0789 Other chest pain: Secondary | ICD-10-CM

## 2022-08-21 MED ORDER — DICLOFENAC SODIUM 1 % EX GEL
4.0000 g | Freq: Four times a day (QID) | CUTANEOUS | 3 refills | Status: DC | PRN
Start: 2022-08-21 — End: 2022-09-24

## 2022-08-21 NOTE — Progress Notes (Unsigned)
Assessment/Plan:  Total time spent caring for the patient today was 120 minutes. This includes time spent before the visit reviewing the chart, time spent during the visit, and time spent after the visit on documentation, etc.  Problem List Items Addressed This Visit       Cardiovascular and Mediastinum   Essential hypertension, benign    Stable on current dose of Ziac 5/6.25 daily Encourage patient to monitor blood pressure at home regularly and take readings over a period to provide a better assessment of control. Continue current antihypertensive medication, Bisoprolol-hydrochlorothiazide (Ziac) at 5-6.25 mg daily       Relevant Orders   Urinalysis, Routine w reflex microscopic (Completed)   Microalbumin / creatinine urine ratio (Completed)     Other   Screening for malignant neoplasm of prostate   Relevant Orders   PSA (Completed)   Family history of BRCA gene positive   Relevant Orders   BRCAssure Comprehensive Panel   Right-sided chest wall pain    The patient's symptoms may represent a range of musculoskeletal conditions, including potential soft tissue or nerve involvement secondary to previous surgical procedures and repetitive motion tasks.  Plan: Order an ultrasound of the right chest soft tissue to evaluate for possible soft tissue abnormalities. Instruct patient to use diclofenac Sodium (Voltaren) 1% gel on the right elbow for pain relief and to perform home exercises. Order thoracic spine x-rayto assess potential contribution of the thoracic spine to the patient's pain. Consider a referral to a physical therapist for targeted exercises and ergonomic advice for the elbow pain      Relevant Orders   [place] CHEST SOFT TISSUE   Hyperkalemia    The mildly elevated potassium level may suggest renal dysfunction or medication effect but requires a follow-up test to confirm.  Plan:  Repeat Basic Metabolic Panel (BMP) to reassess potassium levels and renal function, and  follow treatment accordingly.      Relevant Orders   Basic Metabolic Panel (BMET) (Completed)   Right elbow pain   Relevant Medications   diclofenac Sodium (VOLTAREN) 1 % GEL   Chronic right-sided thoracic back pain - Primary   Relevant Orders   DG Thoracic Spine W/Swimmers    There are no discontinued medications.  Return in about 6 months (around 02/21/2023) for BP.    Subjective:   Encounter date: 08/21/2022  Louis Lamb is a 62 y.o. male who has Attention deficit disorder; Rhinitis, allergic; Essential hypertension, benign; Hyperlipidemia; Screening for malignant neoplasm of prostate; Personal history of colonic adenomas; Paresthesia; Pes planovalgus, acquired, left; Tinnitus of both ears; Chronic right-sided low back pain; Insomnia; Acute cough; Family history of BRCA gene positive; Right-sided chest wall pain; Hyperkalemia; Right elbow pain; and Chronic right-sided thoracic back pain on their problem list..   He  has a past medical history of Allergic rhinitis, Allergy, GERD (gastroesophageal reflux disease), Hypertension, Nevus, NEVUS, ATYPICAL (04/23/2007), Obesity, Personal history of colonic adenomas (10/20/2012), and RBBB (right bundle branch block with left anterior fascicular block).Marland Kitchen   He presents with chief complaint of Medical Management of Chronic Issues (B/p & cholesterol f/u & BRCAssure Comprehensive Panel) .  CHIEF COMPLAINT: Louis Lamb presents for medical management of chronic issues including blood pressure, cholesterol follow-up, and BRCAssure Comprehensive Panel.  HISTORY OF PRESENT ILLNESS:  Blood Pressure. Patient reports home readings slightly elevated compared to in-clinic measurement of 118/78. No symptoms such as chest pain, shortness of breath, current blurry vision, or headaches noted. Last several weeks of  blood pressure readings were not recorded by the patient.   Vision Disturbance. Reported an episode of blurry vision with floaters  experienced during golf, lasting approximately 15-20 minutes without any associated symptoms like headache or hearing changes. Described as an isolated incident with no recurrence.  Hyperkalemia. Recent blood work indicates elevated potassium of 5.9. No symptoms suggestive of hyperkalemia such as palpitations, shortness of breath, or significant change in heart rate. Possible causes discussed include renal function, hydration status, medication effects, and stress.   Back Pain. Chronic intermittent pain in the back discussed. Noted to be in two distinct locations with varying characteristics: a sharp pain near the spine and a dull, more intense pain lower down towards the ribs. Imaging to date has not demonstrated a known cause, but may not have targeted the correct area for accurate diagnosis as symptoms are more thoracically located than lower back.  Elbow Pain. New complaint of forearm and elbow pain, possibly associated with ergonomics and repetitive motion at work desk.  Patient suggests a cubital tunnel etiology due to pain location. No numbness or significant wrist pain reported.   Family History of BRCA Gene Positive. Interest in BRCA testing panel due to family history and concern for breast cancer.  HPI:   Review of Systems  Eyes:  Positive for blurred vision (one episode of transient vision disturbances, has now resolved). Negative for double vision, photophobia, pain, discharge and redness.  Respiratory:  Negative for shortness of breath.   Cardiovascular:  Negative for chest pain, palpitations and leg swelling.  Neurological:  Negative for dizziness, sensory change, speech change, loss of consciousness, weakness and headaches.    Past Surgical History:  Procedure Laterality Date   COLONOSCOPY     2014 (polyps)  and 2018 (no polyps)   LASIK     LIPOMA EXCISION     On back    Outpatient Medications Prior to Visit  Medication Sig Dispense Refill   aspirin 81 MG tablet Take 81  mg by mouth daily.       bisoprolol-hydrochlorothiazide (ZIAC) 5-6.25 MG tablet Take 1 tablet by mouth daily. 30 tablet 0   cholecalciferol (VITAMIN D3) 25 MCG (1000 UNIT) tablet      GLUCOSAMINE HCL PO Take by mouth.     rosuvastatin (CRESTOR) 5 MG tablet Take 1 tablet (5 mg total) by mouth daily. 30 tablet 0   zolpidem (AMBIEN) 10 MG tablet zolpidem 10 mg tablet     Amphetamine ER 12.5 MG TBED Take 1 tablet by mouth daily. (Patient not taking: Reported on 08/21/2022)     benzonatate (TESSALON) 100 MG capsule Take 1 capsule (100 mg total) by mouth 2 (two) times daily as needed for cough. (Patient not taking: Reported on 03/06/2022) 20 capsule 0   Turmeric 500 MG CAPS      No facility-administered medications prior to visit.    Family History  Adopted: Yes  Problem Relation Age of Onset   Arthritis Mother    Throat cancer Father        smoked   BRCA 1/2 Sister    Breast cancer Sister    Colon cancer Paternal Grandmother    Pancreatic cancer Neg Hx    Rectal cancer Neg Hx    Stomach cancer Neg Hx     Social History   Socioeconomic History   Marital status: Married    Spouse name: Not on file   Number of children: 2   Years of education: Not on file  Highest education level: Not on file  Occupational History   Occupation: Medical supplies salesman    Employer: [employer]  Tobacco Use   Smoking status: Never   Smokeless tobacco: Never  Vaping Use   Vaping Use: Never used  Substance and Sexual Activity   Alcohol use: Yes    Alcohol/week: 4.0 standard drinks of alcohol    Types: 4 Cans of beer per week   Drug use: No   Sexual activity: Yes  Other Topics Concern   Not on file  Social History Narrative   Not on file   Social Determinants of Health   Financial Resource Strain: Not on file  Food Insecurity: Not on file  Transportation Needs: Not on file  Physical Activity: Not on file  Stress: Not on file  Social Connections: Not on file  Intimate Partner Violence:  Not on file                                                                                                  Objective:  Physical Exam: BP 118/78 (BP Location: Left Arm, Patient Position: Sitting, Cuff Size: Large)   Pulse 77   Temp 97.8 F (36.6 C) (Temporal)   Wt 191 lb (86.6 kg)   SpO2 100%   BMI 25.90 kg/m     Physical Exam Constitutional:      Appearance: Normal appearance.  HENT:     Head: Normocephalic and atraumatic.     Right Ear: Hearing normal.     Left Ear: Hearing normal.     Nose: Nose normal.  Eyes:     General: No scleral icterus.       Right eye: No discharge.        Left eye: No discharge.     Extraocular Movements: Extraocular movements intact.  Cardiovascular:     Rate and Rhythm: Normal rate and regular rhythm.     Heart sounds: Normal heart sounds.  Pulmonary:     Effort: Pulmonary effort is normal.     Breath sounds: Normal breath sounds.  Chest:     Chest wall: Tenderness (Right lateral chest wall there is a scar from prior lipoma resection, location of tenderness) present.  Abdominal:     Palpations: Abdomen is soft.     Tenderness: There is no abdominal tenderness.  Musculoskeletal:     Right elbow: Tenderness present in lateral epicondyle.     Thoracic back: Bony tenderness (Midthoracic, tenderness just lateral of spine, no deformity) present.  Skin:    General: Skin is warm.     Findings: No rash.  Neurological:     General: No focal deficit present.     Mental Status: He is alert.     Cranial Nerves: No cranial nerve deficit.  Psychiatric:        Mood and Affect: Mood normal.        Behavior: Behavior normal.        Thought Content: Thought content normal.        Judgment: Judgment normal.        Labs from 08/08/2022  1.  CMP with potassium 5.9, mildly elevated glucose of 102 GFR 90, remainder within normal limits  Lab Results  Component Value Date   CHOL 184 08/08/2022   HDL 65.20 08/08/2022   LDLCALC 94 08/08/2022    LDLDIRECT 163.0 01/10/2017   TRIG 120.0 08/08/2022   CHOLHDL 3 08/08/2022   3.  Lipoprotein a less than 10  Past imaging: Past imaging studies including thoracic spine films and MRI of the lumbar spine, indicating mild spondylosis and no acute osseous injury.  Narrative & Impression  CLINICAL DATA:  Increased breath sounds in the left base.   EXAM: CHEST - 2 VIEW   COMPARISON:  09/04/2012   FINDINGS: The heart size and mediastinal contours are within normal limits. Both lungs are clear. The visualized skeletal structures are unremarkable.   IMPRESSION: No active cardiopulmonary disease.     Electronically Signed   By: Alcide Clever M.D.   On: 01/28/2022 22:50   CLINICAL DATA:  Low back pain radiating to the right side for 2 years   EXAM: MRI LUMBAR SPINE WITHOUT CONTRAST   TECHNIQUE: Multiplanar, multisequence MR imaging of the lumbar spine was performed. No intravenous contrast was administered.   COMPARISON:  None Available.   FINDINGS: Segmentation:  Standard.   Alignment:  Physiologic.   Vertebrae: No acute fracture, evidence of discitis, or aggressive bone lesion.   Conus medullaris and cauda equina: Conus extends to the L1 level. Conus and cauda equina appear normal.   Paraspinal and other soft tissues: No acute paraspinal abnormality.   Disc levels:   Disc spaces: Degenerative disease with disc height loss at L1-2. Disc desiccation at L5-S1.   T12-L1: No significant disc bulge. No neural foraminal stenosis. No central canal stenosis.   L1-L2: No significant disc bulge. No neural foraminal stenosis. No central canal stenosis.   L2-L3: No significant disc bulge. No neural foraminal stenosis. No central canal stenosis.   L3-L4: Mild broad-based disc bulge. No foraminal or central canal stenosis.   L4-L5: Broad-based disc bulge with a broad shallow central disc protrusion. No foraminal or central canal stenosis.   L5-S1: Minimal broad-based  disc bulge. Mild bilateral foraminal narrowing. No central canal narrowing.   IMPRESSION: 1. Mild lower lumbar spine spondylosis as described above. 2. No acute osseous injury of the lumbar spine.     Electronically Signed   By: Elige Ko M.D.   On: 09/02/2021 13:25 Garner Nash, MD, MS

## 2022-08-21 NOTE — Patient Instructions (Addendum)
For hyperkalemia, we are repeating electrolyte panel. For hypertension, we are checking urine. For family history of breast cancer and screening for prostate cancer, we are getting labs as discussed. For elbow pain, try Voltaren gel. For midline thoracic pain, we are getting an x-ray.   For xray, go to:    [place] [facility] [address] [place], [place], [place] [address] Phone: [phone]  For right lateral chest wall pain, we are getting an ultrasound.

## 2022-08-22 LAB — URINALYSIS, ROUTINE W REFLEX MICROSCOPIC
Bilirubin Urine: NEGATIVE
Hgb urine dipstick: NEGATIVE
Ketones, ur: NEGATIVE
Leukocytes,Ua: NEGATIVE
Nitrite: NEGATIVE
RBC / HPF: NONE SEEN (ref 0–?)
Specific Gravity, Urine: <=1.005 — AB (ref 1.000–1.030)
Total Protein, Urine: NEGATIVE
Urine Glucose: NEGATIVE
Urobilinogen, UA: 0.2 (ref 0.0–1.0)
WBC, UA: NONE SEEN (ref 0–?)
pH: 6.5 (ref 5.0–8.0)

## 2022-08-22 LAB — BASIC METABOLIC PANEL
BUN: 15 mg/dL (ref 6–23)
CO2: 29 mEq/L (ref 19–32)
Calcium: 9.8 mg/dL (ref 8.4–10.5)
Chloride: 99 mEq/L (ref 96–112)
Creatinine, Ser: 0.98 mg/dL (ref 0.40–1.50)
GFR: 83.93 mL/min (ref >60.00)
Glucose, Bld: 95 mg/dL (ref 70–99)
Potassium: 5.4 mEq/L — ABNORMAL HIGH (ref 3.5–5.1)
Sodium: 140 mEq/L (ref 135–145)

## 2022-08-22 LAB — MICROALBUMIN / CREATININE URINE RATIO
Creatinine,U: 36.1 mg/dL
Microalb Creat Ratio: 1.9 mg/g (ref 0.0–30.0)
Microalb, Ur: <0.7 mg/dL (ref 0.0–1.9)

## 2022-08-22 LAB — PSA: PSA: 0.88 ng/mL (ref 0.10–4.00)

## 2022-08-23 ENCOUNTER — Encounter: Payer: Self-pay | Admitting: Family Medicine

## 2022-08-23 NOTE — Assessment & Plan Note (Signed)
The mildly elevated potassium level may suggest renal dysfunction or medication effect but requires a follow-up test to confirm.  Plan:  Repeat Basic Metabolic Panel (BMP) to reassess potassium levels and renal function, and follow treatment accordingly.

## 2022-08-23 NOTE — Assessment & Plan Note (Signed)
Stable on current dose of Ziac 5/6.25 daily Encourage patient to monitor blood pressure at home regularly and take readings over a period to provide a better assessment of control. Continue current antihypertensive medication, Bisoprolol-hydrochlorothiazide (Ziac) at 5-6.25 mg daily

## 2022-08-23 NOTE — Assessment & Plan Note (Addendum)
The patient's symptoms may represent a range of musculoskeletal conditions, including potential soft tissue or nerve involvement secondary to previous surgical procedures and repetitive motion tasks.  Plan: Order an ultrasound of the right chest soft tissue to evaluate for possible soft tissue abnormalities. Instruct patient to use diclofenac Sodium (Voltaren) 1% gel on the right elbow for pain relief and to perform home exercises. Order thoracic spine x-rayto assess potential contribution of the thoracic spine to the patient's pain. Consider a referral to a physical therapist for targeted exercises and ergonomic advice for the elbow pain

## 2022-08-24 ENCOUNTER — Other Ambulatory Visit: Payer: Self-pay | Admitting: Family Medicine

## 2022-08-24 ENCOUNTER — Ambulatory Visit (HOSPITAL_BASED_OUTPATIENT_CLINIC_OR_DEPARTMENT_OTHER)
Admission: RE | Admit: 2022-08-24 | Discharge: 2022-08-24 | Disposition: A | Discharge status: Home / Self Care | Payer: No Typology Code available for payment source | Source: Ambulatory Visit | Attending: Family Medicine | Admitting: Family Medicine

## 2022-08-24 DIAGNOSIS — E785 Hyperlipidemia, unspecified: Secondary | ICD-10-CM

## 2022-08-24 DIAGNOSIS — R0789 Other chest pain: Secondary | ICD-10-CM

## 2022-08-24 DIAGNOSIS — I10 Essential (primary) hypertension: Secondary | ICD-10-CM

## 2022-08-28 LAB — BRCASSURE COMPREHENSIVE PANEL

## 2022-08-30 ENCOUNTER — Telehealth: Payer: Self-pay

## 2022-08-30 DIAGNOSIS — Z8481 Family history of carrier of genetic disease: Secondary | ICD-10-CM

## 2022-08-30 NOTE — Telephone Encounter (Signed)
Called pt to inform pt his insurance would not cover labs requested unless pt goes forward with a genetic counselor, pt stated he would like to move forward in the process.

## 2022-09-05 LAB — BRCASSURE COMPREHENSIVE PANEL

## 2022-09-05 NOTE — Addendum Note (Signed)
Addended by: Garnette Gunner on: 09/05/2022 06:45 PM   Modules accepted: Orders

## 2022-09-19 ENCOUNTER — Telehealth: Payer: Self-pay

## 2022-09-19 NOTE — Telephone Encounter (Signed)
Can you please advise, see below.Received message via e-fax from [facility] .

## 2022-09-20 HISTORY — PX: OTHER SURGICAL HISTORY: SHX169

## 2022-09-20 NOTE — Telephone Encounter (Signed)
Potential either of these providers  Lauren Manon Hilding, MS, [facility]  Foye Clock, MS Medical Genetics

## 2022-09-20 NOTE — Telephone Encounter (Signed)
Please send here for genetic counseling.  Clinical [employer] [facility], Room [address] [phone] [facility] Line)  Medical Genetics - [place] [address] at [facility]  7th Floor  [place], [place] [address]  Get Directions Hours of Operation  [phone] - Fri: 8 am - 5 pm Appointments  [phone]) [phone])

## 2022-09-20 NOTE — Telephone Encounter (Signed)
Called ppt left a detailed message on VM with appt date time and location. I also let him know that some info will be sent out via mail from [facility]

## 2022-09-21 ENCOUNTER — Other Ambulatory Visit: Payer: Self-pay

## 2022-09-21 ENCOUNTER — Emergency Department (HOSPITAL_BASED_OUTPATIENT_CLINIC_OR_DEPARTMENT_OTHER)
Admission: EM | Admit: 2022-09-21 | Discharge: 2022-09-21 | Disposition: A | Discharge status: Home / Self Care | Payer: No Typology Code available for payment source | Attending: Emergency Medicine | Admitting: Emergency Medicine

## 2022-09-21 ENCOUNTER — Encounter (HOSPITAL_BASED_OUTPATIENT_CLINIC_OR_DEPARTMENT_OTHER): Payer: Self-pay

## 2022-09-21 ENCOUNTER — Emergency Department (HOSPITAL_BASED_OUTPATIENT_CLINIC_OR_DEPARTMENT_OTHER): Payer: No Typology Code available for payment source

## 2022-09-21 ENCOUNTER — Telehealth: Payer: Self-pay | Admitting: Family Medicine

## 2022-09-21 DIAGNOSIS — R001 Bradycardia, unspecified: Secondary | ICD-10-CM | POA: Insufficient documentation

## 2022-09-21 LAB — CBC
HCT: 36.6 % — ABNORMAL LOW (ref 39.0–52.0)
Hemoglobin: 12.2 g/dL — ABNORMAL LOW (ref 13.0–17.0)
MCH: 29 pg (ref 26.0–34.0)
MCHC: 33.3 g/dL (ref 30.0–36.0)
MCV: 87.1 fL (ref 80.0–100.0)
Platelets: 245 10*3/uL (ref 150–400)
RBC: 4.2 MIL/uL — ABNORMAL LOW (ref 4.22–5.81)
RDW: 12.1 % (ref 11.5–15.5)
WBC: 11.8 10*3/uL — ABNORMAL HIGH (ref 4.0–10.5)
nRBC: 0 % (ref 0.0–0.2)

## 2022-09-21 LAB — BASIC METABOLIC PANEL
Anion gap: 9 (ref 5–15)
BUN: 23 mg/dL — ABNORMAL HIGH (ref 6–20)
CO2: 22 mmol/L (ref 22–32)
Calcium: 8.6 mg/dL — ABNORMAL LOW (ref 8.9–10.3)
Chloride: 96 mmol/L — ABNORMAL LOW (ref 98–111)
Creatinine, Ser: 0.93 mg/dL (ref 0.61–1.24)
GFR, Estimated: >60 mL/min (ref >60)
Glucose, Bld: 154 mg/dL — ABNORMAL HIGH (ref 70–99)
Potassium: 3.9 mmol/L (ref 3.5–5.1)
Sodium: 127 mmol/L — ABNORMAL LOW (ref 135–145)

## 2022-09-21 LAB — MAGNESIUM: Magnesium: 2 mg/dL (ref 1.7–2.4)

## 2022-09-21 LAB — TROPONIN I (HIGH SENSITIVITY): Troponin I (High Sensitivity): 5 ng/L (ref <18)

## 2022-09-21 NOTE — ED Provider Notes (Signed)
[place] [facility] AT [facility] Provider Note   CSN: [id number] Arrival date & time: 09/21/22  1525     History Chief Complaint  Patient presents with   Bradycardia    HPI Keean Wilmeth is a 62 y.o. male presenting for chief complaint of low heart rate readings at home.  Denies fevers chills nausea vomiting syncope or shortness of breath. Otherwise ambulatory tolerating p.o. intake.  Notably has been asymptomatic. Had a dental procedure yesterday where they first detected the bradycardia.  He is on bisoprolol and hydrochlorothiazide.  He has also had chronic hyperkalemia.  States that he stopped his ADHD amphetamine approximately month ago which was giving him anxiety.  Patient's recorded medical, surgical, social, medication list and allergies were reviewed in the Snapshot window as part of the initial history.   Review of Systems   Review of Systems  Constitutional:  Negative for chills and fever.  HENT:  Negative for ear pain and sore throat.   Eyes:  Negative for pain and visual disturbance.  Respiratory:  Negative for cough and shortness of breath.   Cardiovascular:  Positive for palpitations. Negative for chest pain.  Gastrointestinal:  Negative for abdominal pain and vomiting.  Genitourinary:  Negative for dysuria and hematuria.  Musculoskeletal:  Negative for arthralgias and back pain.  Skin:  Negative for color change and rash.  Neurological:  Negative for seizures and syncope.  All other systems reviewed and are negative.   Physical Exam Updated Vital Signs BP 125/63   Pulse (!) 32   Temp 99.2 F (37.3 C) (Oral)   Resp 15   Ht 6' (1.829 m)   Wt 85.7 kg   SpO2 97%   BMI 25.63 kg/m  Physical Exam Vitals and nursing note reviewed.  Constitutional:      General: He is not in acute distress.    Appearance: He is well-developed.  HENT:     Head: Normocephalic and atraumatic.  Eyes:     Conjunctiva/sclera: Conjunctivae  normal.  Cardiovascular:     Rate and Rhythm: Normal rate and regular rhythm.     Heart sounds: No murmur heard. Pulmonary:     Effort: Pulmonary effort is normal. No respiratory distress.     Breath sounds: Normal breath sounds.  Abdominal:     Palpations: Abdomen is soft.     Tenderness: There is no abdominal tenderness.  Musculoskeletal:        General: No swelling.     Cervical back: Neck supple.  Skin:    General: Skin is warm and dry.     Capillary Refill: Capillary refill takes less than 2 seconds.  Neurological:     Mental Status: He is alert.  Psychiatric:        Mood and Affect: Mood normal.      ED Course/ Medical Decision Making/ A&P    Procedures Procedures   Medications Ordered in ED Medications - No data to display  Medical Decision Making:    Bradely Rudin is a 62 y.o. male who presented to the ED today with low heart rate readings detailed above.     Patient's presentation is complicated by their history of multiple comorbid medical problems.  Patient placed on continuous vitals and telemetry monitoring while in ED which was reviewed periodically.   Complete initial physical exam performed, notably the patient  was hemodynamically stable no acute distress.  Heart rate in the mid 30s with blood pressures of 120s over 60s.  Reviewed and confirmed nursing documentation for past medical history, family history, social history.    Initial Assessment:   Patient is presenting with asymptomatic bradycardia.  Denies palpitations lightheadedness near syncope.  Denies fevers chills nausea vomit syncope shortness of breath. This most likely from medication effects given his ongoing beta-blocker use and recent discontinuation of ADHD medications Also considered infectious etiology, pulmonary embolism, ACS, metabolic disruption however the seem grossly less likely  This is most consistent with an acute life/limb threatening illness complicated by  underlying chronic conditions.  Initial Plan:  Screening labs including CBC and Metabolic panel to evaluate for infectious or metabolic etiology of disease.  CXR to evaluate for structural/infectious intrathoracic pathology.  Troponin/EKG to evaluate for cardiac pathology. Objective evaluation as below reviewed with plan for close reassessment  Initial Study Results:   Laboratory  All laboratory results reviewed without evidence of clinically relevant pathology.    EKG EKG was reviewed independently. Rate, rhythm, axis, intervals all examined and without medically relevant abnormality. ST segments without concerns for elevations.    Radiology  All images reviewed independently. Agree with radiology report at this time.   DG Chest Portable 1 View  Result Date: 09/21/2022 CLINICAL DATA:  Chest discomfort and bradycardia. EXAM: PORTABLE CHEST 1 VIEW COMPARISON:  01/26/2022 FINDINGS: Lungs are adequately inflated and otherwise clear. Mild stable cardiomegaly. Remainder of the exam is unchanged. IMPRESSION: 1. No acute cardiopulmonary disease. 2. Mild stable cardiomegaly. Electronically Signed   By: Elberta Fortis M.D.   On: 09/21/2022 16:22     Final Assessment and Plan:   Patient's history present on his physical exam findings do not reveal any pathology in the setting of these objective findings. Lab work without acute pathology.  Will refer patient back to primary care.  I recommend he stop the beta-blocker and the hydrochlorothiazide as these are likely causing his laboratory and physiologic abnormalities and check his blood pressure twice daily will plan to follow-up with PCP next week.  Instructed patient to return for further care and management should he develop any symptomatic change.  Disposition:  I have considered need for hospitalization, however, considering all of the above, I believe this patient is stable for discharge at this time.  Patient/family educated about specific  return precautions for given chief complaint and symptoms.  Patient/family educated about follow-up with PCP.     Patient/family expressed understanding of return precautions and need for follow-up. Patient spoken to regarding all imaging and laboratory results and appropriate follow up for these results. All education provided in verbal form with additional information in written form. Time was allowed for answering of patient questions. Patient discharged.    Emergency Department Medication Summary:   Medications - No data to display       Clinical Impression:  1. Bradycardia      Discharge   Final Clinical Impression(s) / ED Diagnoses Final diagnoses:  Bradycardia    Rx / DC Orders ED Discharge Orders     None         Glyn Ade, MD 09/21/22 1610

## 2022-09-21 NOTE — Telephone Encounter (Signed)
FYI: This call has been transferred to Access Nurse. Once the result note has been entered staff can address the message at that time.  Patient called in with the following symptoms:  Red Word: his bp has been 114/58,102/57 and 100/57 with a pulse rate 38, light headed and "things are fuzzy" .    Please advise at [facility] [phone]  Message is routed to Provider Pool

## 2022-09-21 NOTE — ED Triage Notes (Signed)
Pt reports HR in the 30s x1 day. Pt reports "fuzziness on the verge of dizziness but not quite". Pt takes BP meds and thinks they need to be adjusted.

## 2022-09-21 NOTE — Telephone Encounter (Signed)
Pt called stating that NT told him to call EMS. He did they told him all he needed was a OV. He does not want to go to ED. [name] is calling him back.

## 2022-09-21 NOTE — Telephone Encounter (Signed)
Spoke with patient and he said he spoke with EMS and they stated that he needed an OV. Patient stated that his b/p is now 117/62 and pulse is 44 and he's not as light headed as earlier. I advised patient that since we don't have any current openings at our office it would be best that he gets seen in the ED based on his vitals.

## 2022-09-23 ENCOUNTER — Emergency Department (HOSPITAL_COMMUNITY): Payer: No Typology Code available for payment source

## 2022-09-23 ENCOUNTER — Observation Stay (HOSPITAL_COMMUNITY)
Admission: EM | Admit: 2022-09-23 | Discharge: 2022-09-24 | Disposition: A | Discharge status: Home / Self Care | Payer: No Typology Code available for payment source | Attending: Internal Medicine | Admitting: Internal Medicine

## 2022-09-23 ENCOUNTER — Other Ambulatory Visit: Payer: Self-pay

## 2022-09-23 DIAGNOSIS — Z79899 Other long term (current) drug therapy: Secondary | ICD-10-CM | POA: Insufficient documentation

## 2022-09-23 DIAGNOSIS — I10 Essential (primary) hypertension: Secondary | ICD-10-CM | POA: Diagnosis not present

## 2022-09-23 DIAGNOSIS — Z7982 Long term (current) use of aspirin: Secondary | ICD-10-CM | POA: Diagnosis not present

## 2022-09-23 DIAGNOSIS — R001 Bradycardia, unspecified: Secondary | ICD-10-CM | POA: Diagnosis not present

## 2022-09-23 LAB — CBC
HCT: 39 % (ref 39.0–52.0)
HCT: 38 % — ABNORMAL LOW (ref 39.0–52.0)
Hemoglobin: 12.5 g/dL — ABNORMAL LOW (ref 13.0–17.0)
Hemoglobin: 12.8 g/dL — ABNORMAL LOW (ref 13.0–17.0)
MCH: 29.6 pg (ref 26.0–34.0)
MCH: 29.6 pg (ref 26.0–34.0)
MCHC: 32.1 g/dL (ref 30.0–36.0)
MCHC: 33.7 g/dL (ref 30.0–36.0)
MCV: 92.2 fL (ref 80.0–100.0)
MCV: 87.8 fL (ref 80.0–100.0)
Platelets: 276 10*3/uL (ref 150–400)
Platelets: 294 10*3/uL (ref 150–400)
RBC: 4.23 MIL/uL (ref 4.22–5.81)
RBC: 4.33 MIL/uL (ref 4.22–5.81)
RDW: 12.2 % (ref 11.5–15.5)
RDW: 11.9 % (ref 11.5–15.5)
WBC: 11.4 10*3/uL — ABNORMAL HIGH (ref 4.0–10.5)
WBC: 8.4 10*3/uL (ref 4.0–10.5)
nRBC: 0 % (ref 0.0–0.2)
nRBC: 0 % (ref 0.0–0.2)

## 2022-09-23 LAB — BASIC METABOLIC PANEL
Anion gap: 11 (ref 5–15)
BUN: 20 mg/dL (ref 6–20)
CO2: 26 mmol/L (ref 22–32)
Calcium: 9.4 mg/dL (ref 8.9–10.3)
Chloride: 96 mmol/L — ABNORMAL LOW (ref 98–111)
Creatinine, Ser: 0.89 mg/dL (ref 0.61–1.24)
GFR, Estimated: >60 mL/min (ref >60)
Glucose, Bld: 112 mg/dL — ABNORMAL HIGH (ref 70–99)
Potassium: 3.8 mmol/L (ref 3.5–5.1)
Sodium: 133 mmol/L — ABNORMAL LOW (ref 135–145)

## 2022-09-23 LAB — TSH: TSH: 1.34 u[IU]/mL (ref 0.350–4.500)

## 2022-09-23 LAB — CREATININE, SERUM
Creatinine, Ser: 1.06 mg/dL (ref 0.61–1.24)
GFR, Estimated: >60 mL/min (ref >60)

## 2022-09-23 LAB — TROPONIN I (HIGH SENSITIVITY)
Troponin I (High Sensitivity): 54 ng/L — ABNORMAL HIGH (ref <18)
Troponin I (High Sensitivity): 41 ng/L — ABNORMAL HIGH (ref <18)

## 2022-09-23 LAB — HIV ANTIBODY (ROUTINE TESTING W REFLEX): HIV Screen 4th Generation wRfx: NONREACTIVE

## 2022-09-23 MED ORDER — ENOXAPARIN SODIUM 40 MG/0.4ML IJ SOSY
40.0000 mg | PREFILLED_SYRINGE | INTRAMUSCULAR | Status: DC
  Administered 2022-09-23: 40 mg via SUBCUTANEOUS
  Filled 2022-09-23: qty 0.4

## 2022-09-23 MED ORDER — ACETAMINOPHEN 325 MG PO TABS
650.0000 mg | ORAL_TABLET | Freq: Once | ORAL | Status: AC
  Administered 2022-09-23: 650 mg via ORAL
  Filled 2022-09-23: qty 2

## 2022-09-23 MED ORDER — AMOXICILLIN 500 MG PO CAPS
500.0000 mg | ORAL_CAPSULE | Freq: Three times a day (TID) | ORAL | Status: DC
  Administered 2022-09-23 – 2022-09-24 (×3): 500 mg via ORAL
  Filled 2022-09-23 (×3): qty 1

## 2022-09-23 MED ORDER — ATROPINE SULFATE 1 MG/10ML IJ SOSY
1.0000 mg | PREFILLED_SYRINGE | Freq: Once | INTRAMUSCULAR | Status: AC
  Administered 2022-09-23: 1 mg via INTRAVENOUS
  Filled 2022-09-23: qty 10

## 2022-09-23 MED ORDER — HYDROCHLOROTHIAZIDE 25 MG PO TABS
25.0000 mg | ORAL_TABLET | Freq: Every day | ORAL | Status: DC
  Filled 2022-09-23 (×2): qty 1

## 2022-09-23 MED ORDER — OFF THE BEAT BOOK
Freq: Once | Status: AC
  Filled 2022-09-23: qty 1

## 2022-09-23 MED ORDER — ROSUVASTATIN CALCIUM 5 MG PO TABS
5.0000 mg | ORAL_TABLET | Freq: Every day | ORAL | Status: DC
  Administered 2022-09-24: 5 mg via ORAL
  Filled 2022-09-23 (×2): qty 1

## 2022-09-23 MED ORDER — DEXAMETHASONE 4 MG PO TABS
4.0000 mg | ORAL_TABLET | Freq: Once | ORAL | Status: AC
  Administered 2022-09-23: 4 mg via ORAL
  Filled 2022-09-23: qty 1

## 2022-09-23 NOTE — ED Triage Notes (Signed)
Pt here with persistent bradycardia. HR in the 30's in triage. Pt complains of difficulty getting a deep breath, some chest discomfort and feeling lightheaded.

## 2022-09-23 NOTE — ED Notes (Signed)
ED TO INPATIENT HANDOFF REPORT  ED Nurse Name and Phone #: Shawn, [phone]  S Name/Age/Gender Louis Lamb 62 y.o. male Room/Bed: 038C/038C  Code Status   Code Status: Full Code  Home/SNF/Other Home Patient oriented to: self, place, time, and situation Is this baseline? No   Triage Complete: Triage complete  Chief Complaint Symptomatic bradycardia [R00.1]  Triage Note Pt here with persistent bradycardia. HR in the 30's in triage. Pt complains of difficulty getting a deep breath, some chest discomfort and feeling lightheaded.    Allergies No Known Allergies  Level of Care/Admitting Diagnosis ED Disposition     ED Disposition  Admit   Condition  --   Comment  Hospital Area: [facility] [[id number]]  Level of Care: Telemetry Cardiac [[id number]]  May place patient in observation at [facility] or [facility] if equivalent level of care is available:: No  Covid Evaluation: Asymptomatic - no recent exposure (last 10 days) testing not required  Diagnosis: Symptomatic bradycardia [161096]  Admitting Physician: Barnetta Chapel [[id number]]  Attending Physician: Berton Mount I [[id number]]          B Medical/Surgery History Past Medical History:  Diagnosis Date   Allergic rhinitis    Allergy    GERD (gastroesophageal reflux disease)    Hypertension    Nevus    NEVUS, 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   Followed as Primary Care Patient/ [facility]/ [place]  - L thigh     Obesity    Personal history of colonic adenomas 10/20/2012   RBBB (right bundle branch block with left anterior fascicular block)    Past Surgical History:  Procedure Laterality Date   COLONOSCOPY     2014 (polyps)  and 2018 (no polyps)   LASIK     LIPOMA EXCISION     On back   Tooth extraction with bone graft  09/20/2022     A IV Location/Drains/Wounds Patient Lines/Drains/Airways Status     Active Line/Drains/Airways     Name Placement date Placement time Site Days   Peripheral IV  09/23/22 18 G Right Antecubital 09/23/22  1256  Antecubital  less than 1            Intake/Output Last 24 hours No intake or output data in the 24 hours ending 09/23/22 1913  Labs/Imaging Results for orders placed or performed during the hospital encounter of 09/23/22 (from the past 48 hour(s))  Basic metabolic panel     Status: Abnormal   Collection Time: 09/23/22 12:41 PM  Result Value Ref Range   Sodium 133 (L) 135 - 145 mmol/L   Potassium 3.8 3.5 - 5.1 mmol/L   Chloride 96 (L) 98 - 111 mmol/L   CO2 26 22 - 32 mmol/L   Glucose, Bld 112 (H) 70 - 99 mg/dL    Comment: Glucose reference range applies only to samples taken after fasting for at least 8 hours.   BUN 20 6 - 20 mg/dL   Creatinine, Ser 0.45 0.61 - 1.24 mg/dL   Calcium 9.4 8.9 - 40.9 mg/dL   GFR, Estimated >81 >19 mL/min    Comment: (NOTE) Calculated using the CKD-EPI Creatinine Equation (2021)    Anion gap 11 5 - 15    Comment: Performed at [facility], [address] [place], [place] [address]  CBC     Status: Abnormal   Collection Time: 09/23/22 12:41 PM  Result Value Ref Range   WBC 11.4 (H) 4.0 - 10.5 K/uL  RBC 4.23 4.22 - 5.81 MIL/uL   Hemoglobin 12.5 (L) 13.0 - 17.0 g/dL   HCT 16.1 09.6 - 04.5 %   MCV 92.2 80.0 - 100.0 fL   MCH 29.6 26.0 - 34.0 pg   MCHC 32.1 30.0 - 36.0 g/dL   RDW 40.9 81.1 - 91.4 %   Platelets 276 150 - 400 K/uL   nRBC 0.0 0.0 - 0.2 %    Comment: Performed at [facility], [address] [place], [place] [address]  Troponin I (High Sensitivity)     Status: Abnormal   Collection Time: 09/23/22 12:41 PM  Result Value Ref Range   Troponin I (High Sensitivity) 54 (H) <18 ng/L    Comment: RESULT CALLED TO, READ BACK BY AND VERIFIED WITH V,LABUNSKIY RN @1353  09/23/22 E,[place] DELTA CHECK NOTED (NOTE) Elevated high sensitivity troponin I (hsTnI) values and significant  changes across serial measurements may suggest ACS but many other  chronic and acute conditions are  known to elevate hsTnI results.  Refer to the "Links" section for chest pain algorithms and additional  guidance. Performed at [facility], [address] [place], [place] [address]   Troponin I (High Sensitivity)     Status: Abnormal   Collection Time: 09/23/22  2:41 PM  Result Value Ref Range   Troponin I (High Sensitivity) 41 (H) <18 ng/L    Comment: (NOTE) Elevated high sensitivity troponin I (hsTnI) values and significant  changes across serial measurements may suggest ACS but many other  chronic and acute conditions are known to elevate hsTnI results.  Refer to the "Links" section for chest pain algorithms and additional  guidance. Performed at [facility], [address], [place], [place] [address]    DG Chest 2 View  Result Date: 09/23/2022 CLINICAL DATA:  Chest pain EXAM: CHEST - 2 VIEW COMPARISON:  None Available. FINDINGS: Tiny pleural effusions. No consolidation, pneumothorax or edema. Stable cardiopericardial silhouette. Overlapping cardiac leads and defibrillator pads. IMPRESSION: Tiny pleural effusions. Electronically Signed   By: Karen Kays M.D.   On: 09/23/2022 13:54    Pending Labs [employer] (From admission, onward)     Start     Ordered   Signed and Held  HIV Antibody (routine testing w rflx)  (HIV Antibody (Routine testing w reflex) panel)  Once,   R        Signed and Held   Signed and Held  CBC  (enoxaparin (LOVENOX)    CrCl >/= 30 ml/min)  Once,   R       Comments: Baseline for enoxaparin therapy IF NOT ALREADY DRAWN.  Notify MD if PLT < 100 K.    Signed and Held   Signed and Held  Creatinine, serum  (enoxaparin (LOVENOX)    CrCl >/= 30 ml/min)  Once,   R       Comments: Baseline for enoxaparin therapy IF NOT ALREADY DRAWN.    Signed and Held   Signed and Held  Creatinine, serum  (enoxaparin (LOVENOX)    CrCl >/= 30 ml/min)  Weekly,   R     Comments: while on enoxaparin therapy    Signed and Held   Signed and Held  TSH  Once,   R         Signed and Held   Signed and [employer] metabolic panel  Tomorrow morning,   R        Signed and Held  Vitals/Pain Today's Vitals   09/23/22 1306 09/23/22 1400 09/23/22 1500 09/23/22 1628  BP:  (!) 181/93 129/76 (!) 152/73  Pulse: (!) 109 72 (!) 55 (!) 53  Resp:  18 18 18   Temp:    98.1 F (36.7 C)  TempSrc:    Oral  SpO2: 97% 98% (!) 75% 100%  PainSc:        Isolation Precautions No active isolations  Medications Medications  hydrochlorothiazide (HYDRODIURIL) tablet 25 mg (25 mg Oral Not Given 09/23/22 1809)  atropine 1 MG/10ML injection 1 mg (1 mg Intravenous Given 09/23/22 1300)  acetaminophen (TYLENOL) tablet 650 mg (650 mg Oral Given 09/23/22 1624)    Mobility walks     Focused Assessments Cardiac Assessment Handoff:  Cardiac Rhythm: Sinus bradycardia No results found for: "CKTOTAL", "CKMB", "CKMBINDEX", "TROPONINI" No results found for: "DDIMER" Does the Patient currently have chest pain? No    R Recommendations: See Admitting Provider Note  Report given to:   Additional Notes: AOX4, great historian, walky/talky/ continent, wife at bedside [place] at bedside now

## 2022-09-23 NOTE — ED Provider Notes (Signed)
[place] EMERGENCY DEPARTMENT AT [facility] Provider Note   CSN: [id number] Arrival date & time: 09/23/22  1230     History  Chief Complaint  Patient presents with   Chest Pain   Bradycardia    Louis Lamb is a 62 y.o. male with hyperlipidemia, hypertension presents with persistent bradycardia with some chest discomfort, lightheadedness. HR in 30s since 2 days ago. Initially asymptomatic, reports that today he had some shortness of breath, chest tightness, and felt lightheaded.  Patient reports that he discontinued his beta-blocker as directed.  No previous history of ACS.  He denies any drug use, alcohol use.   Chest Pain      Home Medications Prior to Admission medications   Medication Sig Start Date End Date Taking? Authorizing Provider  amoxicillin (AMOXIL) 500 MG capsule Take 500 mg by mouth 3 (three) times daily. 09/18/22  Yes [provider]  cholecalciferol (VITAMIN D3) 25 MCG (1000 UNIT) tablet Take 1,000 Units by mouth daily. 11/14/20  Yes [provider]  dexamethasone (DECADRON) 4 MG tablet Take 4 mg by mouth 3 (three) times daily. 09/20/22  Yes [provider]  GLUCOSAMINE HCL PO Take 1 capsule by mouth daily.   Yes [provider]  rosuvastatin (CRESTOR) 5 MG tablet TAKE ONE TABLET BY MOUTH DAILY 08/24/22  Yes Garnette Gunner, MD  zolpidem (AMBIEN) 10 MG tablet Take 10 mg by mouth at bedtime as needed for sleep.   Yes [provider]  bisoprolol-hydrochlorothiazide [facility]) 5-6.25 MG tablet TAKE ONE TABLET BY MOUTH DAILY Patient not taking: Reported on 09/23/2022 08/24/22   Garnette Gunner, MD  diclofenac Sodium (VOLTAREN) 1 % GEL Apply 4 g topically 4 (four) times daily as needed. Patient not taking: Reported on 09/23/2022 08/21/22   Garnette Gunner, MD      Allergies    Patient has no known allergies.    Review of Systems   Review of Systems  Cardiovascular:  Positive for chest pain.  All other systems  reviewed and are negative.   Physical Exam Updated Vital Signs BP (!) 150/69 (BP Location: Left Arm)   Pulse 72   Temp 98.2 F (36.8 C) (Oral)   Resp 16   Ht 6' (1.829 m)   Wt 89 kg   SpO2 95%   BMI 26.60 kg/m  Physical Exam Vitals and nursing note reviewed.  Constitutional:      General: He is not in acute distress.    Appearance: Normal appearance.  HENT:     Head: Normocephalic and atraumatic.  Eyes:     General:        Right eye: No discharge.        Left eye: No discharge.  Cardiovascular:     Rate and Rhythm: Regular rhythm. Bradycardia present.     Heart sounds: No murmur heard.    No friction rub. No gallop.  Pulmonary:     Effort: Pulmonary effort is normal.     Breath sounds: Normal breath sounds.  Abdominal:     General: Bowel sounds are normal.     Palpations: Abdomen is soft.  Skin:    General: Skin is warm and dry.     Capillary Refill: Capillary refill takes less than 2 seconds.  Neurological:     Mental Status: He is alert and oriented to person, place, and time.  Psychiatric:        Mood and Affect: Mood normal.  Behavior: Behavior normal.     ED Results / Procedures / Treatments   Labs (all labs ordered are listed, but only abnormal results are displayed) Labs Reviewed  BASIC METABOLIC PANEL - Abnormal; Notable for the following components:      Result Value   Sodium 133 (*)    Chloride 96 (*)    Glucose, Bld 112 (*)    All other components within normal limits  CBC - Abnormal; Notable for the following components:   WBC 11.4 (*)    Hemoglobin 12.5 (*)    All other components within normal limits  CBC - Abnormal; Notable for the following components:   Hemoglobin 12.8 (*)    HCT 38.0 (*)    All other components within normal limits  BASIC METABOLIC PANEL - Abnormal; Notable for the following components:   Glucose, Bld 124 (*)    Calcium 8.8 (*)    All other components within normal limits  TROPONIN I (HIGH SENSITIVITY) -  Abnormal; Notable for the following components:   Troponin I (High Sensitivity) 54 (*)    All other components within normal limits  TROPONIN I (HIGH SENSITIVITY) - Abnormal; Notable for the following components:   Troponin I (High Sensitivity) 41 (*)    All other components within normal limits  HIV ANTIBODY (ROUTINE TESTING W REFLEX)  CREATININE, SERUM  TSH    EKG None  Radiology DG Chest 2 View  Result Date: 09/23/2022 CLINICAL DATA:  Chest pain EXAM: CHEST - 2 VIEW COMPARISON:  None Available. FINDINGS: Tiny pleural effusions. No consolidation, pneumothorax or edema. Stable cardiopericardial silhouette. Overlapping cardiac leads and defibrillator pads. IMPRESSION: Tiny pleural effusions. Electronically Signed   By: Karen Kays M.D.   On: 09/23/2022 13:54    Procedures Procedures    Medications Ordered in ED Medications  rosuvastatin (CRESTOR) tablet 5 mg (5 mg Oral Given 09/24/22 0824)  enoxaparin (LOVENOX) injection 40 mg (40 mg Subcutaneous Given 09/23/22 2001)  hydrochlorothiazide (HYDRODIURIL) tablet 25 mg (25 mg Oral Patient Refused/Not Given 09/24/22 1000)  amoxicillin (AMOXIL) capsule 500 mg (500 mg Oral Given 09/24/22 1337)  acetaminophen (TYLENOL) tablet 650 mg (650 mg Oral Given 09/24/22 1219)  atropine 1 MG/10ML injection 1 mg (1 mg Intravenous Given 09/23/22 1300)  acetaminophen (TYLENOL) tablet 650 mg (650 mg Oral Given 09/23/22 1624)  dexamethasone (DECADRON) tablet 4 mg (4 mg Oral Given 09/23/22 2002)  off the beat book ( Does not apply Given 09/24/22 1610)    ED Course/ Medical Decision Making/ A&P Clinical Course as of 09/24/22 1455  Sun Sep 23, 2022  1640 Dr. Dartha Lodge with hospitalist to admit [BH]    Clinical Course User Index [BH] Henderly, Britni A, PA-C                             Medical Decision Making Amount and/or Complexity of Data Reviewed Labs: ordered. Radiology: ordered.  Risk OTC drugs. Prescription drug management. Decision regarding  hospitalization.   This patient is a 62 y.o. male who presents to the ED for concern of persistent bradycardia, lightheadedness, this involves an extensive number of treatment options, and is a complaint that carries with it a high risk of complications and morbidity. The emergent differential diagnosis prior to evaluation includes, but is not limited to, tachybradycardia syndrome, medication induced bradycardia, complete heart block, sick sinus syndrome, versus other etiology including beta-blocker overdose. This is not an exhaustive differential.   Past Medical  History / Co-morbidities / Social History: Hypertension, ADD, recent dental procedure  Additional history: Chart reviewed. Pertinent results include: Extensively reviewed the lab work, evaluation in the emergency department just 2 days prior to arrival for same, but without any symptomatic presentation at that time  Physical Exam: Physical exam performed. The pertinent findings include: Cannot without overt hypotension, stable oxygen saturation on room air, stable respiratory rate.  There is 1 validated oxygen saturation of 75%, however this was while patient was transferring, he was not truly hypoxic during his emergency department stay, heart rate minimum in the emergency department 38, with documented 32 reported in triage.  Lab Tests: I ordered, and personally interpreted labs.  The pertinent results include: CBC overall unremarkable other than mild anemia, hemoglobin 12.8.  Initial troponin elevated at 56 with delta of 41, on his Apple Watch he shows some intermittent episodes of tachycardia while asleep with heart rate going up to around 1 10-1 20, other than demand ischemia overall with low clinical suspicion for inferior MI or other etiology for his elevated troponin at this time.  BMP overall unremarkable.   Imaging Studies: I ordered imaging studies including plain film chest x-ray. I independently visualized and interpreted  imaging which showed no acute intrathoracic abnormality. I agree with the radiologist interpretation.   Cardiac Monitoring:  The patient was maintained on a cardiac monitor.  My attending physician Dr. Jarold Motto viewed and interpreted the cardiac monitored which showed an underlying rhythm of: Sinus bradycardia, right bundle branch block which has been previously noted, PR interval 188, no evidence of second or third-degree heart block. I agree with this interpretation.   Medications: I ordered medication including atropine for bradycardia.  Patient with improvement of bradycardia after atropine, he did have some brief tachycardia, heart rate to 109, and then began to slowly bradycardia down again prior to admission.   Consultations Obtained: I requested consultation with the cardiologist who did recommend admission for monitoring, with consultation to electrophysiology team, recommendation for medicine admit, admission to hospitalist is pending at time of my handoff   Care of Larmar Streich transferred to [facility] and Dr. Jeraldine Loots at the end of my shift as the patient will require reassessment once labs/imaging have resulted. Patient presentation, ED course, and plan of care discussed with review of all pertinent labs and imaging. Please see his/her note for further details regarding further ED course and disposition. Plan at time of handoff is pending medical admission consult call, his disposition is admission at time of my handoff.    Final Clinical Impression(s) / ED Diagnoses Final diagnoses:  Symptomatic bradycardia    Rx / DC Orders ED Discharge Orders          Ordered    LONG TERM MONITOR-LIVE TELEMETRY (3-14 DAYS)       Comments: Hospital applied Evaluate Bradycardia Dr. Nelly Laurence to read   09/24/22 1408              Lakota Schweppe, Paradise H, PA-C 09/24/22 1455    Rondel Baton, MD 09/27/22 1148

## 2022-09-23 NOTE — H&P (Signed)
History and Physical  Jaydis William ZOX:[id number] DOB: [birth year] DOA: 09/23/2022  Referring physician: ER provider PCP: Garnette Gunner, MD  Outpatient Specialists:    Patient coming from: Home.  Chief Complaint: Intermittent lightheadedness and bradycardia  HPI:  Patient is a 62 year old male with past medical history significant for chronic right bundle branch block, obesity, colonic adenoma, ADD, Raynaud's phenomenon, hypertension and hyperlipidemia.  Patient recently underwent dental procedure (about 3 days prior to presentation) and Exparel was one of the local anesthetics used.  Patient has been on bisoprolol/HCTZ combination for his hypertension.  Patient monitors his heart rate with his watch.  Prior to presentation, and after dental procedure, patient was noticing heart rate in the 30s, with intermittent lightheadedness.  Patient has also had heart rates greater than 100 bpm.  Hospitalist team has been asked to admit patient for further assessment and management.  No headache, neck pain, chest pain, shortness of breath, GI symptoms, joint pains, skin rashes or urinary symptoms.  On presentation, EKG done revealed right bundle branch block with heart rate of 40 bpm.  BMP revealed sodium of 133, potassium of 3.8, chloride of 96, CO2 of 26, BUN of 20, serum creatinine of 0.89 with blood sugar of 112.  Initial troponin was 54, but improved to 41.  CBC revealed WBC of 11.4, hemoglobin of 12.5, hematocrit of 39 and platelet count of 276.  Chest x-ray revealed tiny pleural effusion.  Vitals on presentation reveal temperature of 98.4, blood pressure 129/76 and respiratory rate of 18.  ED Course: See above documentation.  ER physician has discussed with the cardiology team.  EP team will see patient in consultation tomorrow. Pertinent labs: As documented above. EKG: Independently reviewed.  Right bundle branch block with heart rate of 40 bpm. Imaging: independently reviewed.  Chest x-ray  revealed tiny pleural effusion.  Review of Systems:  Negative for fever, visual changes, sore throat, rash, new muscle aches, chest pain, SOB, dysuria, bleeding, n/v/abdominal pain.  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 He has never used smokeless tobacco. He reports current alcohol use of about 7.0 standard drinks of alcohol per week. He reports that he does not use drugs.  No Known Allergies  Family History  Adopted: Yes  Problem Relation Age of Onset   Arthritis Mother    Throat cancer Father        smoked   BRCA 1/2 Sister    Breast cancer Sister    Colon cancer Paternal Grandmother    Pancreatic cancer Neg Hx    Rectal cancer Neg Hx    Stomach cancer Neg Hx      Prior to Admission medications   Medication Sig Start Date End Date Taking? Authorizing Provider  Amphetamine ER 12.5 MG  TBED Take 1 tablet by mouth daily. Patient not taking: Reported on 08/21/2022 07/27/16   [provider]  aspirin 81 MG tablet Take 81 mg by mouth daily.      [provider]  bisoprolol-hydrochlorothiazide [facility]) 5-6.25 MG tablet TAKE ONE TABLET BY MOUTH DAILY 08/24/22   Garnette Gunner, MD  cholecalciferol (VITAMIN D3) 25 MCG (1000 UNIT) tablet  11/14/20   [provider]  diclofenac Sodium (VOLTAREN) 1 % GEL Apply 4 g topically 4 (four) times daily as needed. 08/21/22   Garnette Gunner, MD   GLUCOSAMINE HCL PO Take by mouth.    [provider]  rosuvastatin (CRESTOR) 5 MG tablet TAKE ONE TABLET BY MOUTH DAILY 08/24/22   Garnette Gunner, MD  Turmeric 500 MG CAPS  06/14/21   [provider]  zolpidem (AMBIEN) 10 MG tablet zolpidem 10 mg tablet    [provider]    Physical Exam: Vitals:   09/23/22 1306 09/23/22 1400 09/23/22 1500 09/23/22 1628  BP:  (!) 181/93 129/76 (!) 152/73  Pulse: (!) 109 72 (!) 55 (!) 53  Resp:  18 18 18   Temp:    98.1 F (36.7 C)  TempSrc:    Oral  SpO2: 97% 98% (!) 75% 100%     Constitutional:  Appears calm and comfortable Eyes:  No pallor. No jaundice.  ENMT:  external ears, nose appear normal Neck:  Neck is supple. No JVD Respiratory:  CTA bilaterally, no w/r/r.  Respiratory effort normal. No retractions or accessory muscle use Cardiovascular:  S1S2 No LE extremity edema   Abdomen:  Abdomen is obese, soft and non tender. Organs are difficult to assess. Neurologic:  Awake and alert. Moves all limbs.  Wt Readings from Last 3 Encounters:  09/21/22 85.7 kg  08/21/22 86.6 kg  03/06/22 84.1 kg    I have personally reviewed following labs and imaging studies  Labs on Admission:  CBC: Recent Labs  Lab 09/21/22 1556 09/23/22 1241  WBC 11.8* 11.4*  HGB 12.2* 12.5*  HCT 36.6* 39.0  MCV 87.1 92.2  PLT 245 276   Basic Metabolic Panel: Recent Labs  Lab 09/21/22 1556 09/23/22 1241  NA 127* 133*  K 3.9 3.8  CL 96* 96*  CO2 22 26  GLUCOSE 154* 112*  BUN 23* 20  CREATININE 0.93 0.89  CALCIUM 8.6* 9.4  MG 2.0  --    Liver Function Tests: No results for input(s): "AST", "ALT", "ALKPHOS", "BILITOT", "PROT", "ALBUMIN" in the last 168 hours. No results for input(s): "LIPASE", "AMYLASE" in the last 168 hours. No results for input(s): "AMMONIA" in the last 168 hours. Coagulation Profile: No results for input(s): "INR", "PROTIME" in the last 168 hours. Cardiac Enzymes: No results for input(s):  "CKTOTAL", "CKMB", "CKMBINDEX", "TROPONINI" in the last 168 hours. BNP (last 3 results) No results for input(s): "PROBNP" in the last 8760 hours. HbA1C: No results for input(s): "HGBA1C" in the last 72 hours. CBG: No results for input(s): "GLUCAP" in the last 168 hours. Lipid Profile: No results for input(s): "CHOL", "HDL", "LDLCALC", "TRIG", "CHOLHDL", "LDLDIRECT" in the last 72 hours. Thyroid Function Tests: No results for input(s): "TSH", "T4TOTAL", "FREET4", "T3FREE", "THYROIDAB" in the last 72 hours. Anemia Panel: No results for input(s): "VITAMINB12", "FOLATE", "FERRITIN", "TIBC", "IRON", "RETICCTPCT" in the last 72 hours. Urine analysis:    Component Value Date/Time   COLORURINE YELLOW 08/21/2022 1526   APPEARANCEUR CLEAR 08/21/2022 1526   LABSPEC <=1.005 (A) 08/21/2022 1526   PHURINE  6.5 08/21/2022 1526   GLUCOSEU NEGATIVE 08/21/2022 1526   HGBUR NEGATIVE 08/21/2022 1526   BILIRUBINUR NEGATIVE 08/21/2022 1526   KETONESUR NEGATIVE 08/21/2022 1526   UROBILINOGEN 0.2 08/21/2022 1526   NITRITE NEGATIVE 08/21/2022 1526   LEUKOCYTESUR NEGATIVE 08/21/2022 1526   Sepsis Labs: @LABRCNTIP (procalcitonin:4,lacticidven:4) )No results found for this or any previous visit (from the past 240 hour(s)).    Radiological Exams on Admission: DG Chest 2 View  Result Date: 09/23/2022 CLINICAL DATA:  Chest pain EXAM: CHEST - 2 VIEW COMPARISON:  None Available. FINDINGS: Tiny pleural effusions. No consolidation, pneumothorax or edema. Stable cardiopericardial silhouette. Overlapping cardiac leads and defibrillator pads. IMPRESSION: Tiny pleural effusions. Electronically Signed   By: Karen Kays M.D.   On: 09/23/2022 13:54    EKG: Independently reviewed.   Principal Problem:   Symptomatic bradycardia   Assessment/Plan Symptomatic bradycardia/lightheadedness:  -Admit patient for further assessment and management. -Continue telemetry monitoring.   -Bisoprolol is already on hold.    -Cardiology team has been consulted.  EP team will see patient tomorrow. -Tachybradycardia syndrome also remains a possibility. -Check TSH. -Cycle cardiac enzymes.  Initial troponin was 54, subsequent troponin was 41, in the setting of absence of chest pain. -Continue to monitor patient closely and manage expectantly.  Hypertension: -Controlled. -Goal blood pressure should be less than 130/80 mmHg. -Beta-blocker (bisoprolol) is currently on hold. -He should be okay to continue his CTZ 25 Mg p.o. once daily from tomorrow morning.   -Will monitor blood pressure closely.  Hyperlipidemia: -Continue with statin.  ADD: Patient stopped ADD medication about a month ago. -Patient tells me that he is coping fine.  Recent dental procedure: -Complete 1 more dose of dexamethasone 4 Mg p.o. -Complete course of amoxicillin 500 Mg p.o. 3 times daily (the dental team prescribed 10-day course.  Patient started amoxicillin on 09/18/2022).  DVT prophylaxis: Subcutaneous Lovenox Code Status: Full code Family Communication:  Disposition Plan: Home eventually Consults called: ER team is consulted to cardiology team. Admission status: Observation..  Time spent: 85 minutes.  Minutes  Berton Mount, MD  [facility] Pager #: [phone] 7PM-7AM contact night coverage as above  09/23/2022, 4:53 PM

## 2022-09-24 ENCOUNTER — Ambulatory Visit (INDEPENDENT_AMBULATORY_CARE_PROVIDER_SITE_OTHER): Payer: No Typology Code available for payment source

## 2022-09-24 ENCOUNTER — Observation Stay (HOSPITAL_BASED_OUTPATIENT_CLINIC_OR_DEPARTMENT_OTHER): Payer: No Typology Code available for payment source

## 2022-09-24 ENCOUNTER — Other Ambulatory Visit: Payer: Self-pay | Admitting: Physician Assistant

## 2022-09-24 DIAGNOSIS — R001 Bradycardia, unspecified: Secondary | ICD-10-CM

## 2022-09-24 DIAGNOSIS — I498 Other specified cardiac arrhythmias: Secondary | ICD-10-CM | POA: Diagnosis not present

## 2022-09-24 LAB — ECHOCARDIOGRAM COMPLETE
AR max vel: 2.54 cm2
AV Area VTI: 2.67 cm2
AV Area mean vel: 2.97 cm2
AV Mean grad: 2 mmHg
AV Peak grad: 5.6 mmHg
Ao pk vel: 1.18 m/s
Area-P 1/2: 4.21 cm2
Height: 72 in
S' Lateral: 3.3 cm
Weight: 3137.6 oz

## 2022-09-24 LAB — BASIC METABOLIC PANEL
Anion gap: 11 (ref 5–15)
BUN: 20 mg/dL (ref 6–20)
CO2: 24 mmol/L (ref 22–32)
Calcium: 8.8 mg/dL — ABNORMAL LOW (ref 8.9–10.3)
Chloride: 100 mmol/L (ref 98–111)
Creatinine, Ser: 0.99 mg/dL (ref 0.61–1.24)
GFR, Estimated: >60 mL/min (ref >60)
Glucose, Bld: 124 mg/dL — ABNORMAL HIGH (ref 70–99)
Potassium: 4.2 mmol/L (ref 3.5–5.1)
Sodium: 135 mmol/L (ref 135–145)

## 2022-09-24 MED ORDER — AMLODIPINE BESYLATE 10 MG PO TABS: 10.0000 mg | ORAL_TABLET | Freq: Every day | ORAL | 1 refills | Status: DC

## 2022-09-24 MED ORDER — HYDROCHLOROTHIAZIDE 25 MG PO TABS: 25.0000 mg | ORAL_TABLET | Freq: Every day | ORAL | 0 refills | Status: DC

## 2022-09-24 MED ORDER — ACETAMINOPHEN 325 MG PO TABS
650.0000 mg | ORAL_TABLET | Freq: Four times a day (QID) | ORAL | Status: DC | PRN
  Administered 2022-09-24: 650 mg via ORAL
  Filled 2022-09-24: qty 2

## 2022-09-24 NOTE — Plan of Care (Signed)

## 2022-09-24 NOTE — Discharge Summary (Signed)
Physician Discharge Summary  Patient ID: Louis Lamb MRN: [id number] DOB/AGE: 60/26/1962 60 y.o.  Admit date: 09/23/2022 Discharge date: 09/24/2022  Admission Diagnoses:  Discharge Diagnoses:  Principal Problem:   Symptomatic bradycardia   Discharged Condition: {condition:18240}  Hospital Course: ***  Consults: {consultation:18241}  Significant Diagnostic Studies: {diagnostics:18242}  Treatments: {Tx:18249}  Discharge Exam: Blood pressure (!) 150/69, pulse 72, temperature 98.2 F (36.8 C), temperature source Oral, resp. rate 16, height 6' (1.829 m), weight 89 kg, SpO2 95 %. {physical WUJW:1191478}  Disposition: Discharge disposition: 01-Home or Self Care       Discharge Instructions     Diet - low sodium heart healthy   Complete by: As directed    Increase activity slowly   Complete by: As directed       Allergies as of 09/24/2022   No Known Allergies      Medication List     STOP taking these medications    bisoprolol-hydrochlorothiazide 5-6.25 MG tablet Commonly known as: ZIAC   dexamethasone 4 MG tablet Commonly known as: DECADRON   diclofenac Sodium 1 % Gel Commonly known as: Voltaren   GLUCOSAMINE HCL PO       TAKE these medications    amLODipine 10 MG tablet Commonly known as: NORVASC Take 1 tablet (10 mg total) by mouth daily.   amoxicillin 500 MG capsule Commonly known as: AMOXIL Take 500 mg by mouth 3 (three) times daily.   cholecalciferol 25 MCG (1000 UNIT) tablet Commonly known as: VITAMIN D3 Take 1,000 Units by mouth daily.   hydrochlorothiazide 25 MG tablet Commonly known as: HYDRODIURIL Take 1 tablet (25 mg total) by mouth daily. Start taking on: September 25, 2022   rosuvastatin 5 MG tablet Commonly known as: CRESTOR TAKE ONE TABLET BY MOUTH DAILY   zolpidem 10 MG tablet Commonly known as: AMBIEN Take 10 mg by mouth at bedtime as needed for sleep.         SignedBarnetta Chapel 09/24/2022, 3:51  PM

## 2022-09-24 NOTE — TOC CM/SW Note (Signed)
Transition of Care [facility]) - Inpatient Brief Assessment   Patient Details  Name: Quantay Zaremba MRN: [id number] Date of Birth: 04-Nov-1960  Transition of Care [facility]) CM/SW Contact:    Ronny Bacon, RN Phone Number: 09/24/2022, 3:00 PM   Clinical Narrative: Spoke with patient and wife, Darien Ramus, at bedside. Patient lives with wife at home, works for [employer]. Patient is independent with his ADL's, reports not DME equipment needed. Wife will transport patient home at discharge.  Transition of Care Asessment: Insurance and Status: Insurance coverage has been reviewed Patient has primary care physician: Yes Home environment has been reviewed: Home with Wife Prior level of function:: Independent Prior/Current Home Services: No current home services Social Determinants of Health Reivew: SDOH reviewed no interventions necessary Readmission risk has been reviewed: No Transition of care needs: no transition of care needs at this time

## 2022-09-24 NOTE — Plan of Care (Signed)
  Problem: Education: Goal: Knowledge of General Education information will improve Description: Including pain rating scale, medication(s)/side effects and non-pharmacologic comfort measures 09/24/2022 1600 by Georgiann Mccoy, RN Outcome: Adequate for Discharge 09/24/2022 0855 by Georgiann Mccoy, RN Outcome: Progressing   Problem: Health Behavior/Discharge Planning: Goal: Ability to manage health-related needs will improve 09/24/2022 1600 by Georgiann Mccoy, RN Outcome: Adequate for Discharge 09/24/2022 0855 by Georgiann Mccoy, RN Outcome: Progressing   Problem: Clinical Measurements: Goal: Ability to maintain clinical measurements within normal limits will improve 09/24/2022 1600 by Georgiann Mccoy, RN Outcome: Adequate for Discharge 09/24/2022 0855 by Georgiann Mccoy, RN Outcome: Progressing Goal: Will remain free from infection 09/24/2022 1600 by Georgiann Mccoy, RN Outcome: Adequate for Discharge 09/24/2022 0855 by Georgiann Mccoy, RN Outcome: Progressing Goal: Diagnostic test results will improve 09/24/2022 1600 by Georgiann Mccoy, RN Outcome: Adequate for Discharge 09/24/2022 0855 by Georgiann Mccoy, RN Outcome: Progressing Goal: Respiratory complications will improve 09/24/2022 1600 by Georgiann Mccoy, RN Outcome: Adequate for Discharge 09/24/2022 0855 by Georgiann Mccoy, RN Outcome: Progressing Goal: Cardiovascular complication will be avoided 09/24/2022 1600 by Georgiann Mccoy, RN Outcome: Adequate for Discharge 09/24/2022 0855 by Georgiann Mccoy, RN Outcome: Progressing   Problem: Activity: Goal: Risk for activity intolerance will decrease 09/24/2022 1600 by Georgiann Mccoy, RN Outcome: Adequate for Discharge 09/24/2022 0855 by Georgiann Mccoy, RN Outcome: Progressing   Problem: Nutrition: Goal: Adequate nutrition will be maintained 09/24/2022 1600 by Georgiann Mccoy, RN Outcome: Adequate for Discharge 09/24/2022 0855 by Georgiann Mccoy, RN Outcome:  Progressing   Problem: Coping: Goal: Level of anxiety will decrease 09/24/2022 1600 by Georgiann Mccoy, RN Outcome: Adequate for Discharge 09/24/2022 0855 by Georgiann Mccoy, RN Outcome: Progressing   Problem: Elimination: Goal: Will not experience complications related to bowel motility 09/24/2022 1600 by Georgiann Mccoy, RN Outcome: Adequate for Discharge 09/24/2022 0855 by Georgiann Mccoy, RN Outcome: Progressing Goal: Will not experience complications related to urinary retention 09/24/2022 1600 by Georgiann Mccoy, RN Outcome: Adequate for Discharge 09/24/2022 0855 by Georgiann Mccoy, RN Outcome: Progressing   Problem: Pain Managment: Goal: General experience of comfort will improve 09/24/2022 1600 by Georgiann Mccoy, RN Outcome: Adequate for Discharge 09/24/2022 0855 by Georgiann Mccoy, RN Outcome: Progressing   Problem: Safety: Goal: Ability to remain free from injury will improve 09/24/2022 1600 by Georgiann Mccoy, RN Outcome: Adequate for Discharge 09/24/2022 0855 by Georgiann Mccoy, RN Outcome: Progressing   Problem: Skin Integrity: Goal: Risk for impaired skin integrity will decrease 09/24/2022 1600 by Georgiann Mccoy, RN Outcome: Adequate for Discharge 09/24/2022 0855 by Georgiann Mccoy, RN Outcome: Progressing

## 2022-09-24 NOTE — Consult Note (Addendum)
Cardiology Consultation   Patient ID: Willa Stockstill MRN: [id number]; DOB: Sep 26, 1960  Admit date: 09/23/2022 Date of Consult: 09/24/2022  PCP:  Garnette Gunner, MD   [facility] Providers Cardiologist:  None     Patient Profile:   Louis Lamb is a 62 y.o. male with a hx of RBBB, colonic adenoma, ADD, Raynaud's phenomenon, HTN, HLD, obesity who is being seen 09/24/2022 for the evaluation of bradycardia at the request of Dr. Dartha Lodge.  History of Present Illness:   Mr. Dacquisto had a recent dental procedure (3-4 days ago, prescribed , pending to complete 1 more dose of dexamethasone 4 Mg p.o. And a course of amoxicillin 500 Mg p.o. 3 times daily (the dental team prescribed 10-day course. started amoxicillin on 09/18/2022).  He was seen in the ER 09/21/22 for bradycardia that was noted at his dental procedure the day prior, they discussed that he had stopped his amphetamine dose (for his ADD a month prior 2/ anxiety), on bisoprolol/HCTZ for BP Discharged with stable VS and advised to stop his bisoprolol/HCTZ and f/u with his PMD  Since hs dental procedure he has noted slow HRs via his watch, with some dizziness and sought care. Found again  in SB 40's, labs largely unremarkable and afebrile with stable BP  LABS 6/7: NA 127 >> now 133 > 135 K+ 3.8, 4.2 BUN/Creat 20/0.89 > 0.99 HS Trop on 6/7 was 5 >> this admit 54 > 41 WBC 11.8 > 11.4 > 8.4 H/H 12/38 Plts 294  He has a dental extraction and bone graft done on 09/20/22, pre-procedure the oral surgeon mentioned his HR about/in the 40's, felt stable for the procedure He used Exparel local anesthesia, the pat has noted since then that can cause bradycardia (1/2 life is 1.2-2.9 hours)  He went to the ER noted persistent low HRs, was advised to stop his BP med (last dose 6/7) and discharged to home.  Last evening he did start to feel a little lightheaded , no near syncope or syncope. And came in . The evening before  he had some CP when particularly when laying on his L  or R side, his wife says he also seemed to have unusual breathing pattern.  Here he has had some floaters in his vision, and some intermittent "kaleidescope" type colors in his periphery, both of which he has had before, but not regularly. Not dizzy, lightheaded here.   BP stable (elevated)  In review with pharmacy, Exparel 1/2 life 13-34hours with local anesthesia effects towards 72hours or longer depending on are of use.  Unclear systemic absorption rates.  In review of his HR data on his watch, going back weeks HRs 40's-90's-100 He golfs once a week, walks the course, but no exercise otherwise  HRs since his procedure 30's-70's or so Here with leg lifts/arm exercising HR briskly rises frm mid 30's > 40's-50 range.  Past Medical History:  Diagnosis Date   Allergic rhinitis    Allergy    GERD (gastroesophageal reflux disease)    Hypertension    Nevus    NEVUS, 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   Followed as Primary Care Patient/ [facility]/ [facility]  - L thigh     Obesity    Personal history of colonic adenomas 10/20/2012   RBBB (right bundle branch block with left anterior fascicular block)     Past Surgical History:  Procedure Laterality Date   COLONOSCOPY     2014 (polyps)  and 2018 (no polyps)  LASIK     LIPOMA EXCISION     On back   Tooth extraction with bone graft  09/20/2022     Home Medications:  Prior to Admission medications   Medication Sig Start Date End Date Taking? Authorizing Provider  amoxicillin (AMOXIL) 500 MG capsule Take 500 mg by mouth 3 (three) times daily. 09/18/22  Yes [provider]  cholecalciferol (VITAMIN D3) 25 MCG (1000 UNIT) tablet Take 1,000 Units by mouth daily. 11/14/20  Yes [provider]  dexamethasone (DECADRON) 4 MG tablet Take 4 mg by mouth 3 (three) times daily. 09/20/22  Yes [provider]  GLUCOSAMINE HCL PO Take 1 capsule by mouth daily.   Yes [provider]  rosuvastatin (CRESTOR) 5 MG tablet TAKE ONE TABLET BY MOUTH DAILY 08/24/22  Yes Garnette Gunner, MD  zolpidem (AMBIEN) 10 MG tablet Take 10 mg by mouth at bedtime as needed for sleep.   Yes [provider]  bisoprolol-hydrochlorothiazide [facility]) 5-6.25 MG tablet TAKE ONE TABLET BY MOUTH DAILY Patient not taking: Reported on 09/23/2022 08/24/22   Garnette Gunner, MD  diclofenac Sodium (VOLTAREN) 1 % GEL Apply 4 g topically 4 (four) times daily as needed. Patient not taking: Reported on 09/23/2022 08/21/22   Garnette Gunner, MD    Inpatient Medications: Scheduled Meds:  amoxicillin  500 mg Oral Q8H   enoxaparin (LOVENOX) injection  40 mg Subcutaneous Q24H   hydrochlorothiazide  25 mg Oral Daily   rosuvastatin  5 mg Oral Daily   Continuous Infusions:  PRN Meds:   Allergies:   No Known Allergies  Social History:   Social History   Socioeconomic History   Marital status: Married    Spouse name: Not on file   Number of children: 2   Years of education: Not on file   Highest education level: Not on file  Occupational History   Occupation: Medical supplies salesman    Employer: [employer]  Tobacco Use   Smoking status: Never   Smokeless tobacco: Never  Vaping Use   Vaping Use: Never used  Substance and Sexual Activity   Alcohol use: Yes    Alcohol/week: 7.0 standard drinks of alcohol    Types: 7 Cans of beer per week   Drug use: No   Sexual activity: Yes  Other Topics Concern   Not on file  Social History Narrative   Not on file   Social Determinants of Health   Financial Resource Strain: Not on file  Food Insecurity: Not on file  Transportation Needs: Not on file  Physical Activity: Not on file  Stress: Not on file  Social Connections: Not on file  Intimate Partner Violence: Not on file    Family History:   Family History  Adopted: Yes  Problem Relation Age of Onset   Arthritis Mother    Throat cancer Father        smoked   BRCA 1/2  Sister    Breast cancer Sister    Colon cancer Paternal Grandmother    Pancreatic cancer Neg Hx    Rectal cancer Neg Hx    Stomach cancer Neg Hx      ROS:  Please see the history of present illness.  All other ROS reviewed and negative.     Physical Exam/Data:   Vitals:   09/23/22 2104 09/24/22 0059 09/24/22 0524 09/24/22 0758  BP: 113/65 133/77 (!) 152/82 (!) 172/70  Pulse: (!) 40 (!) 40 (!) 36 (!) 37  Resp: 17 17 13 16   Temp: 98.9 F (37.2 C)  97.8 F (36.6 C) 98.3 F (36.8 C)  TempSrc: Oral  Oral Oral  SpO2: 97% 98% 95% 98%  Weight: 89 kg     Height: 6' (1.829 m)      No intake or output data in the 24 hours ending 09/24/22 1045    09/23/2022    9:04 PM 09/21/2022    3:42 PM 08/21/2022    2:02 PM  Last 3 Weights  Weight (lbs) 196 lb 1.6 oz 189 lb 191 lb  Weight (kg) 88.95 kg 85.73 kg 86.637 kg     Body mass index is 26.6 kg/m.  General:  Well nourished, well developed, in no acute distress HEENT: normal Neck: no JVD Vascular: No carotid bruits; Distal pulses 2+ bilaterally Cardiac:  RRR; bradycardic, no murmurs, gallops or rubs Lungs:  clear to auscultation bilaterally, no wheezing, rhonchi or rales  Abd: soft, nontender, no hepatomegaly  Ext: no edema Musculoskeletal:  No deformities, BUE and BLE strength normal and equal Skin: warm and dry  Neuro:  CNs 2-12 intact, no focal abnormalities noted Psych:  Normal affect   EKG:  The EKG was personally reviewed and demonstrates:    SB 40bpm, RBBB (old)  OLD 09/21/22: SB 37bpm, RBBB 07/03/19: SB 49bpm, RBBB  Telemetry:  Telemetry was personally reviewed and demonstrates:    SB/SR mostly 30's-40's gets to the 70's    Relevant CV Studies:  Echo is ordered, pending  Laboratory Data:  High Sensitivity Troponin:   Recent Labs  Lab 09/21/22 1556 09/23/22 1241 09/23/22 1441  TROPONINIHS 5 54* 41*     Chemistry Recent Labs  Lab 09/21/22 1556 09/23/22 1241 09/23/22 2046 09/24/22 0318  NA 127* 133*   --  135  K 3.9 3.8  --  4.2  CL 96* 96*  --  100  CO2 22 26  --  24  GLUCOSE 154* 112*  --  124*  BUN 23* 20  --  20  CREATININE 0.93 0.89 1.06 0.99  CALCIUM 8.6* 9.4  --  8.8*  MG 2.0  --   --   --   GFRNONAA >60 >60 >60 >60  ANIONGAP 9 11  --  11    No results for input(s): "PROT", "ALBUMIN", "AST", "ALT", "ALKPHOS", "BILITOT" in the last 168 hours. Lipids No results for input(s): "CHOL", "TRIG", "HDL", "LABVLDL", "LDLCALC", "CHOLHDL" in the last 168 hours.  Hematology Recent Labs  Lab 09/21/22 1556 09/23/22 1241 09/23/22 2046  WBC 11.8* 11.4* 8.4  RBC 4.20* 4.23 4.33  HGB 12.2* 12.5* 12.8*  HCT 36.6* 39.0 38.0*  MCV 87.1 92.2 87.8  MCH 29.0 29.6 29.6  MCHC 33.3 32.1 33.7  RDW 12.1 12.2 11.9  PLT 245 276 294   Thyroid  Recent Labs  Lab 09/23/22 2046  TSH 1.340    BNPNo results for input(s): "BNP", "PROBNP" in the last 168 hours.  DDimer No results for input(s): "DDIMER" in the last 168 hours.   Radiology/Studies:  DG Chest 2 View  Result Date: 09/23/2022 CLINICAL DATA:  Chest pain EXAM: CHEST - 2 VIEW COMPARISON:  None Available. FINDINGS: Tiny pleural effusions. No consolidation, pneumothorax or edema. Stable cardiopericardial silhouette. Overlapping cardiac leads and defibrillator pads. IMPRESSION: Tiny pleural effusions. Electronically Signed   By: Karen Kays M.D.   On: 09/23/2022 13:54   DG Chest Portable 1 View  Result Date: 09/21/2022 CLINICAL DATA:  Chest discomfort and bradycardia. EXAM: PORTABLE CHEST  1 VIEW COMPARISON:  01/26/2022 FINDINGS: Lungs are adequately inflated and otherwise clear. Mild stable cardiomegaly. Remainder of the exam is unchanged. IMPRESSION: 1. No acute cardiopulmonary disease. 2. Mild stable cardiomegaly. Electronically Signed   By: Elberta Fortis M.D.   On: 09/21/2022 16:22     Assessment and Plan:   SB RBBB at baseline EKG from 2021 with HR 49 Unknown where his baseline HR is usually, his sleep number bed tells them 40's-50's  with sleep Watch data as above baseline HRs resting 40's > now 30's Echo is pending  Pre-procedure he reports that he was started on antibiotic for his cracked tooth that was showing some inflammation already and advised to complete the course of antibiotics afterwards  With stable BP, HRs that do not see to far from his baseline and minimal if any symptoms at rest, with recent dental work and rec for antibiotics, not in a rush to implant PPM, though he may/may not need without clear symptoms ? If the Exparel is a reversible cause Bisoprolol has washed out  Will hold NPO, but suspect we will not proceed today  Bisoprolol 1/2 life 9-12 hours, last dose 6/7  Exparil 1/2 life 13-24 hours (though unclear how much systemic absorption there is)depending on route of administration, tissue vascularity, and degree of vasodilation surrounding the area of injection  Procedure sate 09/20/22  HTN follow Avoid nodal blocking agents would consider amlodipine    Risk Assessment/Risk Scores:    For questions or updates, please contact [facility] Please consult [email or web address] for contact info under    Signed, Sheilah Pigeon, PA-C  09/24/2022 10:45 AM

## 2022-09-24 NOTE — Progress Notes (Unsigned)
ZIO AT [id number] from office inventory applied to patient.  Dr. Nelly Laurence to read.

## 2022-09-24 NOTE — Progress Notes (Addendum)
TTE with preserved LVEF, no significant VHD. Staff here to place the monitor has left Dr. Nelly Laurence has seen the patient Our office will put Zio AT monitor on this afternoon. I have sent a secure chat message to IM MD as well OK to discharge from our perspective  [name], PA-C

## 2022-09-25 ENCOUNTER — Telehealth: Payer: Self-pay

## 2022-09-25 NOTE — Transitions of Care (Post Inpatient/ED Visit) (Signed)
   09/25/2022  Name: Louis Lamb MRN: [id number] DOB: 06-03-1960  Today's TOC FU Call Status: Today's TOC FU Call Status:: Successful TOC FU Call Competed TOC FU Call Complete Date: 09/25/22  Transition Care Management Follow-up Telephone Call Date of Discharge: 09/24/22 Discharge Facility: [facility]) Type of Discharge: Inpatient Admission Primary Inpatient Discharge Diagnosis:: Symptomatic bradycardia How have you been since you were released from the hospital?: Better Any questions or concerns?: No  Items Reviewed: Did you receive and understand the discharge instructions provided?: Yes Medications obtained,verified, and reconciled?: Yes (Medications Reviewed) Any new allergies since your discharge?: No Dietary orders reviewed?: Yes Do you have support at home?: Yes  Medications Reviewed Today: Medications Reviewed Today     Reviewed by Merleen Nicely, LPN (Licensed Practical Nurse) on 09/25/22 at 1040  Med List Status: <None>   Medication Order Taking? Sig Documenting Provider Last Dose Status Informant  amLODipine (NORVASC) 10 MG tablet [id number] Yes Take 1 tablet (10 mg total) by mouth daily. Barnetta Chapel, MD Taking Active   amoxicillin (AMOXIL) 500 MG capsule [id number] Yes Take 500 mg by mouth 3 (three) times daily. [provider] Taking Active Self  cholecalciferol (VITAMIN D3) 25 MCG (1000 UNIT) tablet [id number] Yes Take 1,000 Units by mouth daily. [provider] Taking Active Self  hydrochlorothiazide (HYDRODIURIL) 25 MG tablet [id number] Yes Take 1 tablet (25 mg total) by mouth daily. Barnetta Chapel, MD Taking Active   rosuvastatin (CRESTOR) 5 MG tablet [id number] Yes TAKE ONE TABLET BY MOUTH DAILY Garnette Gunner, MD Taking Active Self  zolpidem (AMBIEN) 10 MG tablet [id number] Yes Take 10 mg by mouth at bedtime as needed for sleep. [provider] Taking Active Self            Home Care and  Equipment/Supplies: Were Home Health Services Ordered?: No Any new equipment or medical supplies ordered?: No  Functional Questionnaire: Do you need assistance with bathing/showering or dressing?: No Do you need assistance with meal preparation?: No Do you need assistance with eating?: No Do you have difficulty maintaining continence: No Do you need assistance with getting out of bed/getting out of a chair/moving?: No Do you have difficulty managing or taking your medications?: No  Follow up appointments reviewed: PCP Follow-up appointment confirmed?: Yes Date of PCP follow-up appointment?: 10/02/22 Follow-up Provider: Dr Janee Morn [facility] Follow-up appointment confirmed?: Yes Date of Specialist follow-up appointment?: 10/12/22 Follow-Up Specialty Provider:: Dr Nelly Laurence Do you need transportation to your follow-up appointment?: No Do you understand care options if your condition(s) worsen?: Yes-patient verbalized understanding    SIGNATURE  Woodfin Ganja LPN [facility] Nurse Health Advisor Direct Dial [phone]

## 2022-10-02 ENCOUNTER — Encounter: Payer: Self-pay | Admitting: Family Medicine

## 2022-10-02 ENCOUNTER — Ambulatory Visit: Payer: No Typology Code available for payment source | Admitting: Family Medicine

## 2022-10-02 VITALS — BP 138/78 | HR 79 | Temp 97.6°F | Wt 190.2 lb

## 2022-10-02 DIAGNOSIS — Z09 Encounter for follow-up examination after completed treatment for conditions other than malignant neoplasm: Secondary | ICD-10-CM

## 2022-10-02 DIAGNOSIS — R001 Bradycardia, unspecified: Secondary | ICD-10-CM

## 2022-10-02 DIAGNOSIS — I495 Sick sinus syndrome: Secondary | ICD-10-CM

## 2022-10-02 NOTE — Progress Notes (Signed)
Assessment/Plan:   Problem List Items Addressed This Visit   None   There are no discontinued medications.  No follow-ups on file.    Subjective:   Encounter date: 10/02/2022  Louis Lamb is a 62 y.o. male who has Attention deficit disorder; Rhinitis, allergic; Essential hypertension, benign; Hyperlipidemia; Screening for malignant neoplasm of prostate; Personal history of colonic adenomas; Paresthesia; Pes planovalgus, acquired, left; Tinnitus of both ears; Chronic right-sided low back pain; Insomnia; Acute cough; Family history of BRCA gene positive; Right-sided chest wall pain; Hyperkalemia; Right elbow pain; Chronic right-sided thoracic back pain; and Symptomatic bradycardia on their problem list..   He  has a past medical history of Allergic rhinitis, Allergy, GERD (gastroesophageal reflux disease), Hypertension, Nevus, NEVUS, ATYPICAL (04/23/2007), Obesity, Personal history of colonic adenomas (10/20/2012), and RBBB (right bundle branch block with left anterior fascicular block).Marland Kitchen   He presents with chief complaint of Hospitalization Follow-up (Dx: Symptomatic bradycardia. B/p readings at home 126/80, 142/78, 119/75, 137/84) .     HPI:   ROS  Past Surgical History:  Procedure Laterality Date  . COLONOSCOPY     2014 (polyps)  and 2018 (no polyps)  . LASIK    . LIPOMA EXCISION     On back  . Tooth extraction with bone graft  09/20/2022    Outpatient Medications Prior to Visit  Medication Sig Dispense Refill  . amLODipine (NORVASC) 10 MG tablet Take 1 tablet (10 mg total) by mouth daily. 30 tablet 1  . cholecalciferol (VITAMIN D3) 25 MCG (1000 UNIT) tablet Take 1,000 Units by mouth daily.    . hydrochlorothiazide (HYDRODIURIL) 25 MG tablet Take 1 tablet (25 mg total) by mouth daily. 30 tablet 0  . rosuvastatin (CRESTOR) 5 MG tablet TAKE ONE TABLET BY MOUTH DAILY 90 tablet 2  . amoxicillin (AMOXIL) 500 MG capsule Take 500 mg by mouth 3 (three) times daily.  (Patient not taking: Reported on 10/02/2022)    . zolpidem (AMBIEN) 10 MG tablet Take 10 mg by mouth at bedtime as needed for sleep. (Patient not taking: Reported on 10/02/2022)     No facility-administered medications prior to visit.    Family History  Adopted: Yes  Problem Relation Age of Onset  . Arthritis Mother   . Throat cancer Father        smoked  . BRCA 1/2 Sister   . Breast cancer Sister   . Colon cancer Paternal Grandmother   . Pancreatic cancer Neg Hx   . Rectal cancer Neg Hx   . Stomach cancer Neg Hx     Social History   Socioeconomic History  . Marital status: Married    Spouse name: Not on file  . Number of children: 2  . Years of education: Not on file  . Highest education level: Not on file  Occupational History  . Occupation: Chiropodist: [employer]  Tobacco Use  . Smoking status: Never  . Smokeless tobacco: Never  Vaping Use  . Vaping Use: Never used  Substance and Sexual Activity  . Alcohol use: Yes    Alcohol/week: 7.0 standard drinks of alcohol    Types: 7 Cans of beer per week  . Drug use: No  . Sexual activity: Yes  Other Topics Concern  . Not on file  Social History Narrative  . Not on file   Social Determinants of Health   Financial Resource Strain: Not on file  Food Insecurity: Not on file  Transportation Needs:  Not on file  Physical Activity: Not on file  Stress: Not on file  Social Connections: Not on file  Intimate Partner Violence: Not on file                                                                                                  Objective:  Physical Exam: BP 138/78 (BP Location: Left Arm, Patient Position: Sitting, Cuff Size: Large)   Pulse 79   Temp 97.6 F (36.4 C) (Temporal)   Wt 190 lb 3.2 oz (86.3 kg)   SpO2 98%   BMI 25.80 kg/m   BP Readings from Last 3 Encounters:  10/02/22 138/78  09/24/22 (!) 150/69  09/21/22 110/65     Physical Exam  ECHOCARDIOGRAM  COMPLETE  Result Date: 09/24/2022    ECHOCARDIOGRAM REPORT   Patient Name:   Louis Lamb Date of Exam: 09/24/2022 Medical Rec #:  [id number]           Height:       72.0 in Accession #:    [id number]          Weight:       196.1 lb Date of Birth:  March 25, 1961           BSA:          2.113 m Patient Age:    62 years            BP:           150/69 mmHg Patient Gender: M                   HR:           36 bpm. Exam Location:  Inpatient Procedure: 2D Echo, Cardiac Doppler and Color Doppler Indications:    Arrhythmia  History:        Patient has no prior history of Echocardiogram examinations.                 Abnormal ECG; Arrythmias:Bradycardia.  Sonographer:    Darlys Gales Referring Phys: [id number] RENEE LYNN URSUY IMPRESSIONS  1. Patient is bradycardic to the 30s during exam.  2. Left ventricular ejection fraction, by estimation, is 60 to 65%. The left ventricle has normal function. The left ventricle has no regional wall motion abnormalities. Left ventricular diastolic parameters are indeterminate.  3. Right ventricular systolic function is normal. The right ventricular size is normal. Tricuspid regurgitation signal is inadequate for assessing PA pressure.  4. The mitral valve is grossly normal. No evidence of mitral valve regurgitation.  5. The aortic valve is tricuspid. Aortic valve regurgitation is not visualized. Aortic valve sclerosis is present, with no evidence of aortic valve stenosis.  6. The inferior vena cava is normal in size with <50% respiratory variability, suggesting right atrial pressure of 8 mmHg. FINDINGS  Left Ventricle: Left ventricular ejection fraction, by estimation, is 60 to 65%. The left ventricle has normal function. The left ventricle has no regional wall motion abnormalities. The left ventricular internal cavity size was normal in size. There is  no left ventricular hypertrophy. Left  ventricular diastolic parameters are indeterminate. Right Ventricle: The right ventricular size is  normal. No increase in right ventricular wall thickness. Right ventricular systolic function is normal. Tricuspid regurgitation signal is inadequate for assessing PA pressure. Left Atrium: Left atrial size was normal in size. Right Atrium: Right atrial size was not well visualized. Pericardium: There is no evidence of pericardial effusion. Mitral Valve: The mitral valve is grossly normal. No evidence of mitral valve regurgitation. Tricuspid Valve: The tricuspid valve is normal in structure. Tricuspid valve regurgitation is trivial. Aortic Valve: The aortic valve is tricuspid. Aortic valve regurgitation is not visualized. Aortic valve sclerosis is present, with no evidence of aortic valve stenosis. Aortic valve mean gradient measures 2.0 mmHg. Aortic valve peak gradient measures 5.6  mmHg. Aortic valve area, by VTI measures 2.67 cm. Pulmonic Valve: The pulmonic valve was normal in structure. Pulmonic valve regurgitation is trivial. Aorta: The aortic root is normal in size and structure. Venous: The inferior vena cava is normal in size with less than 50% respiratory variability, suggesting right atrial pressure of 8 mmHg. IAS/Shunts: The atrial septum is grossly normal.  LEFT VENTRICLE PLAX 2D LVIDd:         6.10 cm   Diastology LVIDs:         3.30 cm   LV e' medial:    8.38 cm/s LV PW:         0.90 cm   LV E/e' medial:  11.7 LV IVS:        0.80 cm   LV e' lateral:   4.03 cm/s LVOT diam:     2.00 cm   LV E/e' lateral: 24.3 LV SV:         68 LV SV Index:   32 LVOT Area:     3.14 cm  RIGHT VENTRICLE RV S prime:     10.30 cm/s LEFT ATRIUM             Index        RIGHT ATRIUM           Index LA Vol (A2C):   58.5 ml 27.69 ml/m  RA Area:     13.80 cm LA Vol (A4C):   47.6 ml 22.53 ml/m  RA Volume:   36.00 ml  17.04 ml/m LA Biplane Vol: 55.8 ml 26.41 ml/m  AORTIC VALVE AV Area (Vmax):    2.54 cm AV Area (Vmean):   2.97 cm AV Area (VTI):     2.67 cm AV Vmax:           118.00 cm/s AV Vmean:          68.600 cm/s AV  VTI:            0.255 m AV Peak Grad:      5.6 mmHg AV Mean Grad:      2.0 mmHg LVOT Vmax:         95.30 cm/s LVOT Vmean:        64.800 cm/s LVOT VTI:          0.217 m LVOT/AV VTI ratio: 0.85 MITRAL VALVE MV Area (PHT): 4.21 cm    SHUNTS MV Decel Time: 180 msec    Systemic VTI:  0.22 m MV E velocity: 98.10 cm/s  Systemic Diam: 2.00 cm MV A velocity: 60.40 cm/s MV E/A ratio:  1.62 Laurance Flatten MD Electronically signed by Laurance Flatten MD Signature Date/Time: 09/24/2022/3:28:24 PM    Final    DG Chest 2 View  Result  Date: 09/23/2022 CLINICAL DATA:  Chest pain EXAM: CHEST - 2 VIEW COMPARISON:  None Available. FINDINGS: Tiny pleural effusions. No consolidation, pneumothorax or edema. Stable cardiopericardial silhouette. Overlapping cardiac leads and defibrillator pads. IMPRESSION: Tiny pleural effusions. Electronically Signed   By: Karen Kays M.D.   On: 09/23/2022 13:54   DG Chest Portable 1 View  Result Date: 09/21/2022 CLINICAL DATA:  Chest discomfort and bradycardia. EXAM: PORTABLE CHEST 1 VIEW COMPARISON:  01/26/2022 FINDINGS: Lungs are adequately inflated and otherwise clear. Mild stable cardiomegaly. Remainder of the exam is unchanged. IMPRESSION: 1. No acute cardiopulmonary disease. 2. Mild stable cardiomegaly. Electronically Signed   By: Elberta Fortis M.D.   On: 09/21/2022 16:22   [place] CHEST SOFT TISSUE  Result Date: 08/28/2022 CLINICAL DATA:  Site of a previous lipoma resection 10 OR 15 years ago. Right in her back pain near spine. EXAM: ULTRASOUND OF POSTERIOR BACK SOFT TISSUES TECHNIQUE: Ultrasound examination was performed in the area of clinical concern. COMPARISON:  None Available. FINDINGS: No cause for the patient's symptoms identified.  No masses. IMPRESSION: No cause for the patient's symptoms identified. Electronically Signed   By: Gerome Sam III M.D.   On: 08/28/2022 18:15    Recent Results (from the past 2160 hour(s))  Comp Met (CMET)     Status: Abnormal   Collection  Time: 08/08/22  9:49 AM  Result Value Ref Range   Sodium 143 135 - 145 mEq/L   Potassium 5.9 (H) 3.5 - 5.1 mEq/L   Chloride 103 96 - 112 mEq/L   CO2 28 19 - 32 mEq/L   Glucose, Bld 102 (H) 70 - 99 mg/dL   BUN 12 6 - 23 mg/dL   Creatinine, Ser 1.61 0.40 - 1.50 mg/dL   Total Bilirubin 0.4 0.2 - 1.2 mg/dL   Alkaline Phosphatase 55 39 - 117 U/L   AST 38 (H) 0 - 37 U/L   ALT 43 0 - 53 U/L   Total Protein 7.2 6.0 - 8.3 g/dL   Albumin 4.4 3.5 - 5.2 g/dL   GFR 09.60 >45.40 mL/min    Comment: Calculated using the CKD-EPI Creatinine Equation (2021)   Calcium 10.1 8.4 - 10.5 mg/dL  Lipid panel     Status: None   Collection Time: 08/08/22  9:49 AM  Result Value Ref Range   Cholesterol 184 0 - 200 mg/dL    Comment: ATP III Classification       Desirable:  < 200 mg/dL               Borderline High:  200 - 239 mg/dL          High:  > = 981 mg/dL   Triglycerides 191.4 0.0 - 149.0 mg/dL    Comment: Normal:  <782 mg/dLBorderline High:  150 - 199 mg/dL   HDL 95.62 >13.08 mg/dL   VLDL 65.7 0.0 - 84.6 mg/dL   LDL Cholesterol 94 0 - 99 mg/dL   Total CHOL/HDL Ratio 3     Comment:                Men          Women1/2 Average Risk     3.4          3.3Average Risk          5.0          4.42X Average Risk          9.6  7.13X Average Risk          15.0          11.0                       NonHDL 118.30     Comment: NOTE:  Non-HDL goal should be 30 mg/dL higher than patient's LDL goal (i.e. LDL goal of < 70 mg/dL, would have non-HDL goal of < 100 mg/dL)  Lipoprotein A (LPA)     Status: None   Collection Time: 08/08/22  9:49 AM  Result Value Ref Range   Lipoprotein (a) <10 <75 nmol/L    Comment: . Risk Category   Optimal        < 75 nmol/L   Moderate   75 - 125 nmol/L   High          > 125 nmol/L . Cardiovascular event risk category cut points (optimal, moderate, high) are based on Tsimika S. JACC 2017;69:692-711. Marland Kitchen   PSA     Status: None   Collection Time: 08/21/22  3:26 PM  Result Value  Ref Range   PSA 0.88 0.10 - 4.00 ng/mL    Comment: Test performed using Access Hybritech PSA Assay, a parmagnetic partical, chemiluminecent immunoassay.  Urinalysis, Routine w reflex microscopic     Status: Abnormal   Collection Time: 08/21/22  3:26 PM  Result Value Ref Range   Color, Urine YELLOW Yellow;Lt. Yellow;Straw;Dark Yellow;Amber;Green;Red;Brown   APPearance CLEAR Clear;Turbid;Slightly Cloudy;Cloudy   Specific Gravity, Urine <=1.005 (A) 1.000 - 1.030   pH 6.5 5.0 - 8.0   Total Protein, Urine NEGATIVE Negative   Urine Glucose NEGATIVE Negative   Ketones, ur NEGATIVE Negative   Bilirubin Urine NEGATIVE Negative   Hgb urine dipstick NEGATIVE Negative   Urobilinogen, UA 0.2 0.0 - 1.0   Leukocytes,Ua NEGATIVE Negative   Nitrite NEGATIVE Negative   WBC, UA none seen 0-2/hpf   RBC / HPF none seen 0-2/hpf  Microalbumin / creatinine urine ratio     Status: None   Collection Time: 08/21/22  3:26 PM  Result Value Ref Range   Microalb, Ur <0.7 0.0 - 1.9 mg/dL   Creatinine,U 16.1 mg/dL   Microalb Creat Ratio 1.9 0.0 - 30.0 mg/g  Basic Metabolic Panel (BMET)     Status: Abnormal   Collection Time: 08/21/22  3:26 PM  Result Value Ref Range   Sodium 140 135 - 145 mEq/L   Potassium 5.4 (H) 3.5 - 5.1 mEq/L   Chloride 99 96 - 112 mEq/L   CO2 29 19 - 32 mEq/L   Glucose, Bld 95 70 - 99 mg/dL   BUN 15 6 - 23 mg/dL   Creatinine, Ser 0.96 0.40 - 1.50 mg/dL   GFR 04.54 >09.81 mL/min    Comment: Calculated using the CKD-EPI Creatinine Equation (2021)   Calcium 9.8 8.4 - 10.5 mg/dL  BRCAssure Comprehensive Panel     Status: None   Collection Time: 08/21/22  3:29 PM  Result Value Ref Range   SPECIMEN TYPE Comment     Comment: Whole Blood     PREAUTHORIZATION Comment     Comment: Prior authorization review complete     RESULTS Comment     Comment: NEGATIVE FOR PATHOGENIC VARIANTS  No clinically significant variants or variants of uncertain significance were identified.  GENE                  VARIANT  BRCA1  NEGATIVE No pathogenic variants were                      identified.  BRCA2                NEGATIVE No pathogenic variants were                      identified.     INTERPRETATION Comment     Comment: No pathogenic variants were identified.     ADDITIONAL CLINICAL INFORM. Comment     Comment: NCCN Guidelines  When BRCA1 and BRCA2 results are negative, additional testing may be helpful for some patients. Guidelines from the National Comprehensive Cancer Network(R) (NCCN(R)) recommend considering germline genetic testing for additional breast, ovarian, prostate, and/or pancreatic cancer susceptibility genes in patients meeting any of the criteria in the table below. To discuss comprehensive genetic testing, a [facility] Science writer is available at [phone].  Breast cancer diagnosed <= age 32  Ovarian cancer, pancreatic cancer, male breast cancer, or metastatic/high-risk prostate cancer at any age  Breast cancer diagnosed at any age and one of the following: To aid in PARP inhibitor or olaparib treatment Triple negative breast cancer Multiple primary breast cancers >= 3 total diagnoses of breast cancer in patient and/or close blood relatives  Breast or prostate cancer diagnosed at any age and one of the following: >= 1 close relative with breas t cancer <= age 64 or with triple negative breast cancer at any age >= 1 close relative with ovarian, pancreatic, male breast, or metastatic/high-risk prostate cancer >= 2 close relatives with breast or prostate cancer at any age Ashkenazi Jewish ancestry  Patients with a first or second degree relative meeting certain criteria in this table may consider germline genetic testing as well. Complete criteria may be found at [email or web address]     RECOMMENDATIONS Comment     Comment: Genetic counseling is recommended to discuss the potential clinical and/or reproductive implications of  these results, as well as recommendations for testing family members. To access [facility] please visit [email or web address] or call [phone]).     METHODS AND LIMITATIONS Comment     Comment: Next-generation sequencing: Genomic regions of interest are selected using a custom capture reagent for target enrichment and sequenced via the Illumina(R) next generation sequencing platform. Regions of interest include all exons and intron/exon junctions (+/-20 nucleotides) of the BRCA1 ([id number]) and BRCA2 (NM_000059.3) genes. Regions of interest may be extended to include intronic sequences known to harbor pathogenic/likely pathogenic variants. Sequencing reads are aligned with the human genome reference GRCh37/hg19 build. Minimum mean coverage is 40X. Any segment failing minimum read depth coverage is rescued by bi-directional Sanger sequencing to complete sequence analysis. Variants, including SNVs and CNVs, are identified using a custom bioinformatics pipeline.  Reported variants: Pathogenic and likely pathogenic variants and variants of uncertain significance (VUS) are reported. Non-deletion variants are specified using the numbering and nomenclature recomm ended by the [employer] Variation Society (HGVS, ViralSquad.com.cy). Benign variants are not reported. Variant classification and confirmation are consistent with ACMG standards and guidelines Peggye Pitt, [id number] Garibaldi, UUVO:[id number]). Detailed variant classification information is available upon request. A variant of uncertain significance (VUS) should not be used in clinical decision making; a VUS is classified based on inadequate or conflicting evidence regarding its pathogenicity or clinical relevance.  Limitations: Technologies used do not detect germline mosaicism and do not rule out the presence of large chromosomal aberrations, including  rearrangements, gene fusions, or variants in regions or genes not included in this test, or possible inter/ intragenic interactions between variants. Variant classification and/or interpretation may change over time if more information becomes available. False positive or false negative results may occur for reasons  that include: genetic variants, pseudogene interference, technical handling, blood transfusions, bone marrow transplantation, mislabeling of samples, or erroneous representation of family relationships. For heterozygous variants in the same gene the assay cannot determine whether they are on the same or different chromosome; to determine phase and clinical significance, rarely, parental testing may be required. Exact breakpoints of exon-level deletions/duplications are not determined. The presence of an inherited cancer syndrome due to a different genetic cause cannot be ruled out. Any interpretation should be clinically correlated with information about the patients presentation and relevant family history.  This test was developed and its performance characteristics determined by [facility]. It has not been cleared or approved by the Food and Drug Administration.     REFERENCES Comment     Comment: 1. NCCN Genetic/Familial High Risk Assessment: Breast, Ovarian, and Pancreatic. Version 2.2023. 2. Petrucelli, et al. BRCA1- and BRCA2-Associated Hereditary Breast and Ovarian Cancer. GeneReviews, updated 2022. PMID: [id number].     RELEASED BY Comment     Comment: Component Type      Performed At         Laboratory                                          Director  Technical           Laboratory           Maurine Simmering, component,          Corporation of       MD, PhD processing          [place], [address],                     [place], [place],                     [address]  Technical           [facility]     Marilynn Latino, PhD, component,           Laboratories, [place],   [facility] analysis            [address],                     [place], [place],                     [address]  Professional        Laboratory           W. Wynona Canes component           Corporation of       [place], PhD,                     [place], [address]       [facility]  [facility]                     [place], [place],                     [place], [address]  W. Harlan Stains, PhD, [facility]    Basic metabolic panel     Status: Abnormal   Collection Time: 09/21/22  3:56 PM  Result Value Ref Range   Sodium 127 (L) 135 - 145 mmol/L   Potassium 3.9 3.5 - 5.1 mmol/L   Chloride 96 (L) 98 - 111 mmol/L   CO2 22 22 - 32 mmol/L   Glucose, Bld 154 (H) 70 - 99 mg/dL    Comment: Glucose reference range applies only to samples taken after fasting for at least 8 hours.   BUN 23 (H) 6 - 20 mg/dL   Creatinine, Ser 6.04 0.61 - 1.24 mg/dL   Calcium 8.6 (L) 8.9 - 10.3 mg/dL   GFR, Estimated >54 >09 mL/min    Comment: (NOTE) Calculated using the CKD-EPI Creatinine Equation (2021)    Anion gap 9 5 - 15    Comment: Performed at [facility], [address] [place], [place] [address]  CBC     Status: Abnormal   Collection Time: 09/21/22  3:56 PM  Result Value Ref Range   WBC 11.8 (H) 4.0 - 10.5 K/uL   RBC 4.20 (L) 4.22 - 5.81 MIL/uL   Hemoglobin 12.2 (L) 13.0 - 17.0 g/dL   HCT 47.8 (L) 29.5 - 62.1 %   MCV 87.1 80.0 - 100.0 fL   MCH 29.0 26.0 - 34.0 pg   MCHC 33.3 30.0 - 36.0 g/dL   RDW 30.8 65.7 - 84.6 %   Platelets 245 150 - 400 K/uL   nRBC 0.0 0.0 - 0.2 %    Comment: Performed at [facility], [address] [place], [place] [address]  Troponin I (High Sensitivity)     Status: None   Collection Time: 09/21/22  3:56 PM  Result Value Ref Range   Troponin I (High Sensitivity) 5 <18 ng/L    Comment: (NOTE) Elevated high sensitivity troponin I (hsTnI) values and significant  changes across serial measurements  may suggest ACS but many other  chronic and acute conditions are known to elevate hsTnI results.  Refer to the "Links" section for chest pain algorithms and additional  guidance. Performed at [facility], [address] [place], [place] [address]   Magnesium     Status: None   Collection Time: 09/21/22  3:56 PM  Result Value Ref Range   Magnesium 2.0 1.7 - 2.4 mg/dL    Comment: Performed at [facility], [address] [place], [place] [address]  Basic metabolic panel     Status: Abnormal   Collection Time: 09/23/22 12:41 PM  Result Value Ref Range   Sodium 133 (L) 135 - 145 mmol/L   Potassium 3.8 3.5 - 5.1 mmol/L   Chloride 96 (L) 98 - 111 mmol/L   CO2 26 22 - 32 mmol/L   Glucose, Bld 112 (H) 70 - 99 mg/dL    Comment: Glucose reference range applies only to samples taken after fasting for at least 8 hours.   BUN 20 6 - 20 mg/dL   Creatinine, Ser 0.27 0.61 - 1.24 mg/dL   Calcium 9.4 8.9 - 25.3 mg/dL   GFR, Estimated >66 >44 mL/min  Comment: (NOTE) Calculated using the CKD-EPI Creatinine Equation (2021)    Anion gap 11 5 - 15    Comment: Performed at [facility], [address], [place], [place] [address]  CBC     Status: Abnormal   Collection Time: 09/23/22 12:41 PM  Result Value Ref Range   WBC 11.4 (H) 4.0 - 10.5 K/uL   RBC 4.23 4.22 - 5.81 MIL/uL   Hemoglobin 12.5 (L) 13.0 - 17.0 g/dL   HCT 60.4 54.0 - 98.1 %   MCV 92.2 80.0 - 100.0 fL   MCH 29.6 26.0 - 34.0 pg   MCHC 32.1 30.0 - 36.0 g/dL   RDW 19.1 47.8 - 29.5 %   Platelets 276 150 - 400 K/uL   nRBC 0.0 0.0 - 0.2 %    Comment: Performed at [facility], [address] [place], [place] [address]  Troponin I (High Sensitivity)     Status: Abnormal   Collection Time: 09/23/22 12:41 PM  Result Value Ref Range   Troponin I (High Sensitivity) 54 (H) <18 ng/L    Comment: RESULT CALLED TO, READ BACK BY AND VERIFIED WITH V,LABUNSKIY RN @1353  09/23/22 E,[place] DELTA CHECK  NOTED (NOTE) Elevated high sensitivity troponin I (hsTnI) values and significant  changes across serial measurements may suggest ACS but many other  chronic and acute conditions are known to elevate hsTnI results.  Refer to the "Links" section for chest pain algorithms and additional  guidance. Performed at [facility], [address], [place], [place] [address]   Troponin I (High Sensitivity)     Status: Abnormal   Collection Time: 09/23/22  2:41 PM  Result Value Ref Range   Troponin I (High Sensitivity) 41 (H) <18 ng/L    Comment: (NOTE) Elevated high sensitivity troponin I (hsTnI) values and significant  changes across serial measurements may suggest ACS but many other  chronic and acute conditions are known to elevate hsTnI results.  Refer to the "Links" section for chest pain algorithms and additional  guidance. Performed at [facility], [address] [place], [place] [address]   HIV Antibody (routine testing w rflx)     Status: None   Collection Time: 09/23/22  8:46 PM  Result Value Ref Range   HIV Screen 4th Generation wRfx Non Reactive Non Reactive    Comment: Performed at [facility], [address], [place], [place] [address]  CBC     Status: Abnormal   Collection Time: 09/23/22  8:46 PM  Result Value Ref Range   WBC 8.4 4.0 - 10.5 K/uL   RBC 4.33 4.22 - 5.81 MIL/uL   Hemoglobin 12.8 (L) 13.0 - 17.0 g/dL   HCT 13.2 (L) 44.0 - 10.2 %   MCV 87.8 80.0 - 100.0 fL   MCH 29.6 26.0 - 34.0 pg   MCHC 33.7 30.0 - 36.0 g/dL   RDW 72.5 36.6 - 44.0 %   Platelets 294 150 - 400 K/uL   nRBC 0.0 0.0 - 0.2 %    Comment: Performed at [facility], [address] [place], [place] [address]  Creatinine, serum     Status: None   Collection Time: 09/23/22  8:46 PM  Result Value Ref Range   Creatinine, Ser 1.06 0.61 - 1.24 mg/dL   GFR, Estimated >59 >56 mL/min    Comment: (NOTE) Calculated using the CKD-EPI Creatinine Equation (2021) Performed at [facility], [address], [place], [place] [address]   TSH     Status: None   Collection Time: 09/23/22  8:46 PM  Result Value Ref Range   TSH 1.340 0.350 - 4.500 uIU/mL    Comment: Performed by a 3rd Generation assay with a functional sensitivity of <=0.01 uIU/mL. Performed at [facility], [address] [place], [place] [address]   Basic metabolic panel     Status: Abnormal   Collection Time: 09/24/22  3:18 AM  Result Value Ref Range   Sodium 135 135 - 145 mmol/L   Potassium 4.2 3.5 - 5.1 mmol/L   Chloride 100 98 - 111 mmol/L   CO2 24 22 - 32 mmol/L   Glucose, Bld 124 (H) 70 - 99 mg/dL    Comment: Glucose reference range applies only to samples taken after fasting for at least 8 hours.   BUN 20 6 - 20 mg/dL   Creatinine, Ser 0.98 0.61 - 1.24 mg/dL   Calcium 8.8 (L) 8.9 - 10.3 mg/dL   GFR, Estimated >11 >91 mL/min    Comment: (NOTE) Calculated using the CKD-EPI Creatinine Equation (2021)    Anion gap 11 5 - 15    Comment: Performed at [facility], [address], [place], [place] [address]  ECHOCARDIOGRAM COMPLETE     Status: None   Collection Time: 09/24/22  2:57 PM  Result Value Ref Range   Weight 3,137.6 oz   Height 72 in   BP 150/69 mmHg   S' Lateral 3.30 cm   AR max vel 2.54 cm2   AV Area VTI 2.67 cm2   AV Mean grad 2.0 mmHg   AV Peak grad 5.6 mmHg   Ao pk vel 1.18 m/s   Area-P 1/2 4.21 cm2   AV Area mean vel 2.97 cm2   Est EF 60 - 65%         Garner Nash, MD, MS

## 2022-10-04 DIAGNOSIS — Z09 Encounter for follow-up examination after completed treatment for conditions other than malignant neoplasm: Secondary | ICD-10-CM | POA: Insufficient documentation

## 2022-10-04 DIAGNOSIS — I495 Sick sinus syndrome: Secondary | ICD-10-CM | POA: Insufficient documentation

## 2022-10-04 NOTE — Assessment & Plan Note (Signed)
Hospitalized for symptomatic bradycardia potentially secondary to medication.  Differential Diagnosis: Medication-induced bradycardia Sick sinus syndrome Auricular fibrillation  Plan: Hold bisoprolol. Monitor heart rate and symptoms. Continue follow-up with electrophysiology. If needed, consider alternative antihypertensive medications. Ensure compliance with Holter monitor requirements. Increase hydration with balanced solutions like Gatorade. Use sunscreen during outdoor activities.

## 2022-10-12 ENCOUNTER — Ambulatory Visit
Payer: No Typology Code available for payment source | Attending: Cardiovascular Disease | Admitting: Cardiovascular Disease

## 2022-10-12 ENCOUNTER — Encounter: Payer: Self-pay | Admitting: Cardiovascular Disease

## 2022-10-12 VITALS — BP 134/86 | HR 81 | Ht 72.0 in | Wt 192.4 lb

## 2022-10-12 DIAGNOSIS — R001 Bradycardia, unspecified: Secondary | ICD-10-CM

## 2022-10-12 NOTE — Patient Instructions (Signed)
Medication Instructions:  Your physician recommends that you continue on your current medications as directed. Please refer to the Current Medication list given to you today. *If you need a refill on your cardiac medications before your next appointment, please call your pharmacy*   Follow-Up: At [facility], you and your health needs are our priority.  As part of our continuing mission to provide you with exceptional heart care, we have created designated Provider Care Teams.  These Care Teams include your primary Cardiologist (physician) and Advanced Practice Providers (APPs -  Physician Assistants and Nurse Practitioners) who all work together to provide you with the care you need, when you need it.  We recommend signing up for the patient portal called "MyChart".  Sign up information is provided on this After Visit Summary.  MyChart is used to connect with patients for Virtual Visits (Telemedicine).  Patients are able to view lab/test results, encounter notes, upcoming appointments, etc.  Non-urgent messages can be sent to your provider as well.   To learn more about what you can do with MyChart, go to [email or web address].    Your next appointment:   As Needed  Provider:   Augustus Mealor, MD  

## 2022-10-12 NOTE — Progress Notes (Signed)
  Electrophysiology Office Note:    Date:  10/12/2022   ID:  Louis Lamb, DOB [birth year], MRN [id number]  PCP:  Garnette Gunner, MD   [facility] Providers Cardiologist:  None     Referring MD: Garnette Gunner, MD   History of Present Illness:    Louis Lamb is a 62 y.o. male with a medical history significant for sinus bradycardia, right bundle branch block, who presents for hospital follow-up.     He was admitted to [facility] and seen September 24, 2022 for bradycardia.  He was suddenly noted to have a slow heart rate during a dental procedure at the prior day and was referred to the ER.  He had been on amphetamine previously for ADD but it stopped 2 months prior.  Additionally, he was on bisoprolol for hypertension.  He received a small amount of Exparel for his dental procedure, which has been associated with bradycardia.  For additional details refer to the EP consult note from September 24, 2022, which I reviewed in detail today.  A monitor was placed after discharge.       He presents today for follow-up.  He wore his monitor and return to it, but results not yet available.  During the monitoring period he did not have any symptoms suggestive of bradycardia.  He did have transient lightheadedness that occurred upon standing while playing golf.   EKGs/Labs/Other Studies Reviewed Today:    Echocardiogram:  TTE 09/2022 EF 60-65%   Monitors:  Pending  Stress testing:   Advanced imaging:   Cardiac catherization   EKG Interpretation Date/Time:  Friday October 12 2022 09:26:20 EDT Ventricular Rate:  81 PR Interval:  174 QRS Duration:  164 QT Interval:  410 QTC Calculation: 476 R Axis:   -5  Text Interpretation: Normal sinus rhythm Right bundle branch block When compared with ECG of 23-Sep-2022 12:34, Vent. rate has increased BY  41 BPM T wave inversion no longer evident in Lateral leads QT has lengthened Confirmed by York Pellant  [phone]) on 10/12/2022 9:38:28 AM     Physical Exam:    VS:  BP 134/86   Pulse 81   Ht 6' (1.829 m)   Wt 192 lb 6.4 oz (87.3 kg)   SpO2 98%   BMI 26.09 kg/m     Wt Readings from Last 3 Encounters:  10/12/22 192 lb 6.4 oz (87.3 kg)  10/02/22 190 lb 3.2 oz (86.3 kg)  09/23/22 196 lb 1.6 oz (89 kg)     GEN: Well nourished, well developed in no acute distress CARDIAC: RRR, no murmurs, rubs, gallops RESPIRATORY:  Normal work of breathing MUSCULOSKELETAL: no edema    ASSESSMENT & PLAN:    Bradycardia Resolved -- attributable to bisopralol, and possibly Exparel. Monitor is pending -- he did not report having symptoms consistent with bradycardia during the monitoring period.    Hypertension Will defer to PCP Continue amlodipine 10, hydrochlorothiazide 25  Follow-up as needed.     Signed, Maurice Small, MD  10/12/2022 10:08 AM    [facility]

## 2022-10-21 ENCOUNTER — Encounter: Payer: Self-pay | Admitting: Family Medicine

## 2022-10-21 ENCOUNTER — Other Ambulatory Visit: Payer: Self-pay | Admitting: Family Medicine

## 2022-10-22 MED ORDER — HYDROCHLOROTHIAZIDE 25 MG PO TABS: 25.0000 mg | ORAL_TABLET | Freq: Every day | ORAL | 0 refills | Status: DC

## 2022-10-22 MED ORDER — AMLODIPINE BESYLATE 10 MG PO TABS: 10.0000 mg | ORAL_TABLET | Freq: Every day | ORAL | 0 refills | Status: DC

## 2022-10-22 NOTE — Telephone Encounter (Signed)
Chart supports rx. Last OV: 10/02/2022

## 2022-11-01 ENCOUNTER — Encounter: Payer: Self-pay | Admitting: Family Medicine

## 2022-11-02 ENCOUNTER — Encounter: Payer: Self-pay | Admitting: Family Medicine

## 2022-11-02 ENCOUNTER — Ambulatory Visit (INDEPENDENT_AMBULATORY_CARE_PROVIDER_SITE_OTHER): Payer: No Typology Code available for payment source | Admitting: Family Medicine

## 2022-11-02 VITALS — BP 126/72 | HR 66 | Temp 99.1°F | Wt 195.0 lb

## 2022-11-02 DIAGNOSIS — F988 Other specified behavioral and emotional disorders with onset usually occurring in childhood and adolescence: Secondary | ICD-10-CM

## 2022-11-02 DIAGNOSIS — G8929 Other chronic pain: Secondary | ICD-10-CM

## 2022-11-02 DIAGNOSIS — I10 Essential (primary) hypertension: Secondary | ICD-10-CM

## 2022-11-02 DIAGNOSIS — M546 Pain in thoracic spine: Secondary | ICD-10-CM | POA: Diagnosis not present

## 2022-11-02 MED ORDER — OLMESARTAN MEDOXOMIL 20 MG PO TABS
10.0000 mg | ORAL_TABLET | Freq: Every day | ORAL | 2 refills | Status: DC
Start: 2022-11-02 — End: 2022-12-19

## 2022-11-02 NOTE — Assessment & Plan Note (Signed)
Adzenys effective for focus but causing sleep disturbances and anxiety. Plan: Discontinue Adzenys. Discuss alternative ADHD management strategies with psychiatry

## 2022-11-02 NOTE — Patient Instructions (Signed)
Louis Lamb

## 2022-11-02 NOTE — Assessment & Plan Note (Signed)
Intermittent severe back pain, impairing daily activities occasionally. Plan: Consider referral to chiropractor Donnetta Hail for evaluation and treatment. Assess response to chiropractic care and consider further imaging or other interventions if symptoms persist.

## 2022-11-02 NOTE — Assessment & Plan Note (Signed)
Variable blood pressure control. Ankle and foot swelling secondary to amlodipine.  Plan: Discontinue Amlodipine Continue hydrochlorothiazide 25 mg daily. Monitor blood pressure at home; record readings frequently. Consider starting olmesartan 10 mg daily if blood pressure remains elevated (>140/90 mmHg). Check kidney function and electrolytes 2 weeks after starting olmesartan if initiated.

## 2022-11-02 NOTE — Progress Notes (Signed)
Assessment/Plan:   Problem List Items Addressed This Visit   None   Medications Discontinued During This Encounter  Medication Reason   amoxicillin (AMOXIL) 500 MG capsule Completed Course   amLODipine (NORVASC) 10 MG tablet     No follow-ups on file.    Subjective:   Encounter date: 11/02/2022  Louis Lamb is a 62 y.o. male who has Attention deficit disorder; Rhinitis, allergic; Essential hypertension, benign; Hyperlipidemia; Screening for malignant neoplasm of prostate; Personal history of colonic adenomas; Paresthesia; Pes planovalgus, acquired, left; Tinnitus of both ears; Chronic right-sided low back pain; Insomnia; Acute cough; Family history of BRCA gene positive; Right-sided chest wall pain; Hyperkalemia; Right elbow pain; Chronic right-sided thoracic back pain; Symptomatic bradycardia; Hospital discharge follow-up; and Sick sinus syndrome (HCC) on their problem list..   He  has a past medical history of Allergic rhinitis, Allergy, GERD (gastroesophageal reflux disease), Hypertension, Nevus, NEVUS, ATYPICAL (04/23/2007), Obesity, Personal history of colonic adenomas (10/20/2012), and RBBB (right bundle branch block with left anterior fascicular block)..   Chief Complaint: Ankle swelling and blood pressure management.  History of Present Illness:   Hypertension. The patient reports that amlodipine caused swelling in the feet and ankles, particularly painful when wearing shoes. He stopped taking amlodipine two days ago. Blood pressure readings have been variable from 120s to 150s systolic and 79 to 86 diastolic. The patient was formerly on bisoprolol but was switched due to concerns about sick sinus syndrome. He is currently taking hydrochlorothiazide 25 mg with no significant hydration issues despite hot weather.  ADHD. The patient took Adzenys for two days due to work-related needs but experienced sleep disturbances and anxiety, leading him to stop the medication  again.  Back Pain. The patient experiences intermittent severe back pain, primarily in the mornings. A recent episode required him to lay down before he could shower.  Review of Systems  Constitutional:  Negative for chills, diaphoresis, fever, malaise/fatigue and weight loss.  HENT:  Negative for congestion, ear discharge, ear pain and hearing loss.   Eyes:  Negative for blurred vision, double vision, photophobia, pain, discharge and redness.  Respiratory:  Negative for cough, sputum production, shortness of breath and wheezing.   Cardiovascular:  Positive for leg swelling. Negative for chest pain and palpitations.  Gastrointestinal:  Negative for abdominal pain, blood in stool, constipation, diarrhea, heartburn, melena, nausea and vomiting.  Genitourinary:  Negative for dysuria, flank pain, frequency, hematuria and urgency.  Musculoskeletal:  Positive for back pain. Negative for myalgias.  Skin:  Negative for itching and rash.  Neurological:  Negative for dizziness, tingling, tremors, speech change, seizures, loss of consciousness, weakness and headaches.  Psychiatric/Behavioral:  Negative for depression, hallucinations, memory loss, substance abuse and suicidal ideas. The patient has insomnia.   All other systems reviewed and are negative.   Past Surgical History:  Procedure Laterality Date   COLONOSCOPY     2014 (polyps)  and 2018 (no polyps)   LASIK     LIPOMA EXCISION     On back   Tooth extraction with bone graft  09/20/2022    Outpatient Medications Prior to Visit  Medication Sig Dispense Refill   ALPRAZolam (XANAX) 0.25 MG tablet Take 0.25 mg by mouth at bedtime as needed for sleep or anxiety.     Amphetamine ER (ADZENYS XR-ODT) 9.4 MG TBED Take 9.4 mg by mouth as needed.     cholecalciferol (VITAMIN D3) 25 MCG (1000 UNIT) tablet Take 1,000 Units by mouth daily.  hydrochlorothiazide (HYDRODIURIL) 25 MG tablet Take 1 tablet (25 mg total) by mouth daily. 90 tablet 0    LORazepam (ATIVAN) 0.5 MG tablet Take 0.5 mg by mouth as needed for anxiety or sleep.     rosuvastatin (CRESTOR) 5 MG tablet TAKE ONE TABLET BY MOUTH DAILY 90 tablet 2   zolpidem (AMBIEN) 10 MG tablet Take 10 mg by mouth at bedtime as needed for sleep.     amLODipine (NORVASC) 10 MG tablet Take 1 tablet (10 mg total) by mouth daily. (Patient not taking: Reported on 11/02/2022) 90 tablet 0   amoxicillin (AMOXIL) 500 MG capsule Take 500 mg by mouth 3 (three) times daily. (Patient not taking: Reported on 10/12/2022)     No facility-administered medications prior to visit.    Family History  Adopted: Yes  Problem Relation Age of Onset   Arthritis Mother    Throat cancer Father        smoked   BRCA 1/2 Sister    Breast cancer Sister    Colon cancer Paternal Grandmother    Pancreatic cancer Neg Hx    Rectal cancer Neg Hx    Stomach cancer Neg Hx     Social History   Socioeconomic History   Marital status: Married    Spouse name: Not on file   Number of children: 2   Years of education: Not on file   Highest education level: Not on file  Occupational History   Occupation: Medical supplies salesman    Employer: [employer]  Tobacco Use   Smoking status: Never   Smokeless tobacco: Never  Vaping Use   Vaping status: Never Used  Substance and Sexual Activity   Alcohol use: Yes    Alcohol/week: 7.0 standard drinks of alcohol    Types: 7 Cans of beer per week   Drug use: No   Sexual activity: Yes  Other Topics Concern   Not on file  Social History Narrative   Not on file   Social Determinants of Health   Financial Resource Strain: Not on file  Food Insecurity: Not on file  Transportation Needs: Not on file  Physical Activity: Not on file  Stress: Not on file  Social Connections: Not on file  Intimate Partner Violence: Not on file                                                                                                  Objective:  Physical Exam: BP 126/72    Pulse 66   Temp 99.1 F (37.3 C) (Oral)   Wt 195 lb (88.5 kg)   SpO2 98%   BMI 26.45 kg/m     Physical Exam Constitutional:      Appearance: Normal appearance.  HENT:     Head: Normocephalic and atraumatic.     Right Ear: Hearing normal.     Left Ear: Hearing normal.     Nose: Nose normal.  Eyes:     General: No scleral icterus.       Right eye: No discharge.        Left eye: No  discharge.     Extraocular Movements: Extraocular movements intact.  Cardiovascular:     Rate and Rhythm: Normal rate and regular rhythm.     Heart sounds: Normal heart sounds.  Pulmonary:     Effort: Pulmonary effort is normal.     Breath sounds: Normal breath sounds.  Abdominal:     Palpations: Abdomen is soft.     Tenderness: There is no abdominal tenderness.  Musculoskeletal:     Right lower leg: Edema (Trace in foot) present.     Left lower leg: Edema (Trace in foot) present.  Skin:    General: Skin is warm.     Findings: No rash.  Neurological:     General: No focal deficit present.     Mental Status: He is alert.     Cranial Nerves: No cranial nerve deficit.  Psychiatric:        Mood and Affect: Mood normal.        Behavior: Behavior normal.        Thought Content: Thought content normal.        Judgment: Judgment normal.     LONG TERM MONITOR-LIVE TELEMETRY (3-14 DAYS)  Result Date: 10/20/2022 Monitoring time: 14 days Predominant rhythm: Sinus Sinus HR: 29 - 150, AVG 71 bpm <1% atrial ectopy <1% ventricular ectopy Arrhythmia detected: one 4 beat atrial run Patient triggered events: multiple -- lightheaded, chest pain were associated with sinus rhythm   ECHOCARDIOGRAM COMPLETE  Result Date: 09/24/2022    ECHOCARDIOGRAM REPORT   Patient Name:   Louis Lamb Date of Exam: 09/24/2022 Medical Rec #:  [id number]           Height:       72.0 in Accession #:    [id number]          Weight:       196.1 lb Date of Birth:  10-10-1960           BSA:          2.113 m Patient Age:    62  years            BP:           150/69 mmHg Patient Gender: M                   HR:           36 bpm. Exam Location:  Inpatient Procedure: 2D Echo, Cardiac Doppler and Color Doppler Indications:    Arrhythmia  History:        Patient has no prior history of Echocardiogram examinations.                 Abnormal ECG; Arrythmias:Bradycardia.  Sonographer:    Darlys Gales Referring Phys: [id number] RENEE LYNN URSUY IMPRESSIONS  1. Patient is bradycardic to the 30s during exam.  2. Left ventricular ejection fraction, by estimation, is 60 to 65%. The left ventricle has normal function. The left ventricle has no regional wall motion abnormalities. Left ventricular diastolic parameters are indeterminate.  3. Right ventricular systolic function is normal. The right ventricular size is normal. Tricuspid regurgitation signal is inadequate for assessing PA pressure.  4. The mitral valve is grossly normal. No evidence of mitral valve regurgitation.  5. The aortic valve is tricuspid. Aortic valve regurgitation is not visualized. Aortic valve sclerosis is present, with no evidence of aortic valve stenosis.  6. The inferior vena cava is normal in size with <50% respiratory variability, suggesting  right atrial pressure of 8 mmHg. FINDINGS  Left Ventricle: Left ventricular ejection fraction, by estimation, is 60 to 65%. The left ventricle has normal function. The left ventricle has no regional wall motion abnormalities. The left ventricular internal cavity size was normal in size. There is  no left ventricular hypertrophy. Left ventricular diastolic parameters are indeterminate. Right Ventricle: The right ventricular size is normal. No increase in right ventricular wall thickness. Right ventricular systolic function is normal. Tricuspid regurgitation signal is inadequate for assessing PA pressure. Left Atrium: Left atrial size was normal in size. Right Atrium: Right atrial size was not well visualized. Pericardium: There is no evidence  of pericardial effusion. Mitral Valve: The mitral valve is grossly normal. No evidence of mitral valve regurgitation. Tricuspid Valve: The tricuspid valve is normal in structure. Tricuspid valve regurgitation is trivial. Aortic Valve: The aortic valve is tricuspid. Aortic valve regurgitation is not visualized. Aortic valve sclerosis is present, with no evidence of aortic valve stenosis. Aortic valve mean gradient measures 2.0 mmHg. Aortic valve peak gradient measures 5.6  mmHg. Aortic valve area, by VTI measures 2.67 cm. Pulmonic Valve: The pulmonic valve was normal in structure. Pulmonic valve regurgitation is trivial. Aorta: The aortic root is normal in size and structure. Venous: The inferior vena cava is normal in size with less than 50% respiratory variability, suggesting right atrial pressure of 8 mmHg. IAS/Shunts: The atrial septum is grossly normal.  LEFT VENTRICLE PLAX 2D LVIDd:         6.10 cm   Diastology LVIDs:         3.30 cm   LV e' medial:    8.38 cm/s LV PW:         0.90 cm   LV E/e' medial:  11.7 LV IVS:        0.80 cm   LV e' lateral:   4.03 cm/s LVOT diam:     2.00 cm   LV E/e' lateral: 24.3 LV SV:         68 LV SV Index:   32 LVOT Area:     3.14 cm  RIGHT VENTRICLE RV S prime:     10.30 cm/s LEFT ATRIUM             Index        RIGHT ATRIUM           Index LA Vol (A2C):   58.5 ml 27.69 ml/m  RA Area:     13.80 cm LA Vol (A4C):   47.6 ml 22.53 ml/m  RA Volume:   36.00 ml  17.04 ml/m LA Biplane Vol: 55.8 ml 26.41 ml/m  AORTIC VALVE AV Area (Vmax):    2.54 cm AV Area (Vmean):   2.97 cm AV Area (VTI):     2.67 cm AV Vmax:           118.00 cm/s AV Vmean:          68.600 cm/s AV VTI:            0.255 m AV Peak Grad:      5.6 mmHg AV Mean Grad:      2.0 mmHg LVOT Vmax:         95.30 cm/s LVOT Vmean:        64.800 cm/s LVOT VTI:          0.217 m LVOT/AV VTI ratio: 0.85 MITRAL VALVE MV Area (PHT): 4.21 cm    SHUNTS MV Decel Time: 180 msec  Systemic VTI:  0.22 m MV E velocity: 98.10 cm/s   Systemic Diam: 2.00 cm MV A velocity: 60.40 cm/s MV E/A ratio:  1.62 Laurance Flatten MD Electronically signed by Laurance Flatten MD Signature Date/Time: 09/24/2022/3:28:24 PM    Final    DG Chest 2 View  Result Date: 09/23/2022 CLINICAL DATA:  Chest pain EXAM: CHEST - 2 VIEW COMPARISON:  None Available. FINDINGS: Tiny pleural effusions. No consolidation, pneumothorax or edema. Stable cardiopericardial silhouette. Overlapping cardiac leads and defibrillator pads. IMPRESSION: Tiny pleural effusions. Electronically Signed   By: Karen Kays M.D.   On: 09/23/2022 13:54   DG Chest Portable 1 View  Result Date: 09/21/2022 CLINICAL DATA:  Chest discomfort and bradycardia. EXAM: PORTABLE CHEST 1 VIEW COMPARISON:  01/26/2022 FINDINGS: Lungs are adequately inflated and otherwise clear. Mild stable cardiomegaly. Remainder of the exam is unchanged. IMPRESSION: 1. No acute cardiopulmonary disease. 2. Mild stable cardiomegaly. Electronically Signed   By: Elberta Fortis M.D.   On: 09/21/2022 16:22   [place] CHEST SOFT TISSUE  Result Date: 08/28/2022 CLINICAL DATA:  Site of a previous lipoma resection 10 OR 15 years ago. Right in her back pain near spine. EXAM: ULTRASOUND OF POSTERIOR BACK SOFT TISSUES TECHNIQUE: Ultrasound examination was performed in the area of clinical concern. COMPARISON:  None Available. FINDINGS: No cause for the patient's symptoms identified.  No masses. IMPRESSION: No cause for the patient's symptoms identified. Electronically Signed   By: Gerome Sam III M.D.   On: 08/28/2022 18:15    Recent Results (from the past 2160 hour(s))  Comp Met (CMET)     Status: Abnormal   Collection Time: 08/08/22  9:49 AM  Result Value Ref Range   Sodium 143 135 - 145 mEq/L   Potassium 5.9 (H) 3.5 - 5.1 mEq/L   Chloride 103 96 - 112 mEq/L   CO2 28 19 - 32 mEq/L   Glucose, Bld 102 (H) 70 - 99 mg/dL   BUN 12 6 - 23 mg/dL   Creatinine, Ser 2.84 0.40 - 1.50 mg/dL   Total Bilirubin 0.4 0.2 - 1.2 mg/dL    Alkaline Phosphatase 55 39 - 117 U/L   AST 38 (H) 0 - 37 U/L   ALT 43 0 - 53 U/L   Total Protein 7.2 6.0 - 8.3 g/dL   Albumin 4.4 3.5 - 5.2 g/dL   GFR 13.24 >40.10 mL/min    Comment: Calculated using the CKD-EPI Creatinine Equation (2021)   Calcium 10.1 8.4 - 10.5 mg/dL  Lipid panel     Status: None   Collection Time: 08/08/22  9:49 AM  Result Value Ref Range   Cholesterol 184 0 - 200 mg/dL    Comment: ATP III Classification       Desirable:  < 200 mg/dL               Borderline High:  200 - 239 mg/dL          High:  > = 272 mg/dL   Triglycerides 536.6 0.0 - 149.0 mg/dL    Comment: Normal:  <440 mg/dLBorderline High:  150 - 199 mg/dL   HDL 34.74 >25.95 mg/dL   VLDL 63.8 0.0 - 75.6 mg/dL   LDL Cholesterol 94 0 - 99 mg/dL   Total CHOL/HDL Ratio 3     Comment:                Men          Women1/2 Average Risk  3.4          3.3Average Risk          5.0          4.42X Average Risk          9.6          7.13X Average Risk          15.0          11.0                       NonHDL 118.30     Comment: NOTE:  Non-HDL goal should be 30 mg/dL higher than patient's LDL goal (i.e. LDL goal of < 70 mg/dL, would have non-HDL goal of < 100 mg/dL)  Lipoprotein A (LPA)     Status: None   Collection Time: 08/08/22  9:49 AM  Result Value Ref Range   Lipoprotein (a) <10 <75 nmol/L    Comment: . Risk Category   Optimal        < 75 nmol/L   Moderate   75 - 125 nmol/L   High          > 125 nmol/L . Cardiovascular event risk category cut points (optimal, moderate, high) are based on Tsimika S. JACC 2017;69:692-711. Marland Kitchen   PSA     Status: None   Collection Time: 08/21/22  3:26 PM  Result Value Ref Range   PSA 0.88 0.10 - 4.00 ng/mL    Comment: Test performed using Access Hybritech PSA Assay, a parmagnetic partical, chemiluminecent immunoassay.  Urinalysis, Routine w reflex microscopic     Status: Abnormal   Collection Time: 08/21/22  3:26 PM  Result Value Ref Range   Color, Urine YELLOW Yellow;Lt.  Yellow;Straw;Dark Yellow;Amber;Green;Red;Brown   APPearance CLEAR Clear;Turbid;Slightly Cloudy;Cloudy   Specific Gravity, Urine <=1.005 (A) 1.000 - 1.030   pH 6.5 5.0 - 8.0   Total Protein, Urine NEGATIVE Negative   Urine Glucose NEGATIVE Negative   Ketones, ur NEGATIVE Negative   Bilirubin Urine NEGATIVE Negative   Hgb urine dipstick NEGATIVE Negative   Urobilinogen, UA 0.2 0.0 - 1.0   Leukocytes,Ua NEGATIVE Negative   Nitrite NEGATIVE Negative   WBC, UA none seen 0-2/hpf   RBC / HPF none seen 0-2/hpf  Microalbumin / creatinine urine ratio     Status: None   Collection Time: 08/21/22  3:26 PM  Result Value Ref Range   Microalb, Ur <0.7 0.0 - 1.9 mg/dL   Creatinine,U 40.9 mg/dL   Microalb Creat Ratio 1.9 0.0 - 30.0 mg/g  Basic Metabolic Panel (BMET)     Status: Abnormal   Collection Time: 08/21/22  3:26 PM  Result Value Ref Range   Sodium 140 135 - 145 mEq/L   Potassium 5.4 (H) 3.5 - 5.1 mEq/L   Chloride 99 96 - 112 mEq/L   CO2 29 19 - 32 mEq/L   Glucose, Bld 95 70 - 99 mg/dL   BUN 15 6 - 23 mg/dL   Creatinine, Ser 8.11 0.40 - 1.50 mg/dL   GFR 91.47 >82.95 mL/min    Comment: Calculated using the CKD-EPI Creatinine Equation (2021)   Calcium 9.8 8.4 - 10.5 mg/dL  BRCAssure Comprehensive Panel     Status: None   Collection Time: 08/21/22  3:29 PM  Result Value Ref Range   SPECIMEN TYPE Comment     Comment: Whole Blood     PREAUTHORIZATION Comment     Comment: Prior authorization review complete  RESULTS Comment     Comment: NEGATIVE FOR PATHOGENIC VARIANTS  No clinically significant variants or variants of uncertain significance were identified.  GENE                 VARIANT  BRCA1                NEGATIVE No pathogenic variants were                      identified.  BRCA2                NEGATIVE No pathogenic variants were                      identified.     INTERPRETATION Comment     Comment: No pathogenic variants were identified.     ADDITIONAL  CLINICAL INFORM. Comment     Comment: NCCN Guidelines  When BRCA1 and BRCA2 results are negative, additional testing may be helpful for some patients. Guidelines from the National Comprehensive Cancer Network(R) (NCCN(R)) recommend considering germline genetic testing for additional breast, ovarian, prostate, and/or pancreatic cancer susceptibility genes in patients meeting any of the criteria in the table below. To discuss comprehensive genetic testing, a [facility] Science writer is available at [phone].  Breast cancer diagnosed <= age 8  Ovarian cancer, pancreatic cancer, male breast cancer, or metastatic/high-risk prostate cancer at any age  Breast cancer diagnosed at any age and one of the following: To aid in PARP inhibitor or olaparib treatment Triple negative breast cancer Multiple primary breast cancers >= 3 total diagnoses of breast cancer in patient and/or close blood relatives  Breast or prostate cancer diagnosed at any age and one of the following: >= 1 close relative with breas t cancer <= age 4 or with triple negative breast cancer at any age >= 1 close relative with ovarian, pancreatic, male breast, or metastatic/high-risk prostate cancer >= 2 close relatives with breast or prostate cancer at any age Ashkenazi Jewish ancestry  Patients with a first or second degree relative meeting certain criteria in this table may consider germline genetic testing as well. Complete criteria may be found at [email or web address]     RECOMMENDATIONS Comment     Comment: Genetic counseling is recommended to discuss the potential clinical and/or reproductive implications of these results, as well as recommendations for testing family members. To access [facility] please visit [email or web address] or call [phone]).     METHODS AND LIMITATIONS Comment     Comment: Next-generation sequencing: Genomic regions of  interest are selected using a custom capture reagent for target enrichment and sequenced via the Illumina(R) next generation sequencing platform. Regions of interest include all exons and intron/exon junctions (+/-20 nucleotides) of the BRCA1 ([id number]_425956.3) and BRCA2 (NM_000059.3) genes. Regions of interest may be extended to include intronic sequences known to harbor pathogenic/likely pathogenic variants. Sequencing reads are aligned with the human genome reference GRCh37/hg19 build. Minimum mean coverage is 40X. Any segment failing minimum read depth coverage is rescued by bi-directional Sanger sequencing to complete sequence analysis. Variants, including SNVs and CNVs, are identified using a custom bioinformatics pipeline.  Reported variants: Pathogenic and likely pathogenic variants and variants of uncertain significance (VUS) are reported. Non-deletion variants are specified using the numbering and nomenclature recomm ended by the [employer] Variation Society (HGVS, ViralSquad.com.cy). Benign variants are not reported. Variant classification and confirmation are consistent with ACMG standards and guidelines Peggye Pitt, [id number] Garibaldi, ACZY:[id number]).  Detailed variant classification information is available upon request. A variant of uncertain significance (VUS) should not be used in clinical decision making; a VUS is classified based on inadequate or conflicting evidence regarding its pathogenicity or clinical relevance.  Limitations: Technologies used do not detect germline mosaicism and do not rule out the presence of large chromosomal aberrations, including rearrangements, gene fusions, or variants in regions or genes not included in this test, or possible inter/ intragenic interactions between variants. Variant classification and/or interpretation may change over time if more information becomes available. False positive or false negative results may occur for  reasons  that include: genetic variants, pseudogene interference, technical handling, blood transfusions, bone marrow transplantation, mislabeling of samples, or erroneous representation of family relationships. For heterozygous variants in the same gene the assay cannot determine whether they are on the same or different chromosome; to determine phase and clinical significance, rarely, parental testing may be required. Exact breakpoints of exon-level deletions/duplications are not determined. The presence of an inherited cancer syndrome due to a different genetic cause cannot be ruled out. Any interpretation should be clinically correlated with information about the patients presentation and relevant family history.  This test was developed and its performance characteristics determined by [facility]. It has not been cleared or approved by the Food and Drug Administration.     REFERENCES Comment     Comment: 1. NCCN Genetic/Familial High Risk Assessment: Breast, Ovarian, and Pancreatic. Version 2.2023. 2. Petrucelli, et al. BRCA1- and BRCA2-Associated Hereditary Breast and Ovarian Cancer. GeneReviews, updated 2022. PMID: [id number].     RELEASED BY Comment     Comment: Component Type      Performed At         Laboratory                                          Director  Technical           Laboratory           Maurine Simmering, component,          Corporation of       MD, PhD processing          [place], [address],                     [place], [place],                     [address]  Technical           [facility]     Marilynn Latino, PhD, component,          Laboratories, [place],   [facility] analysis            [address]                     [place], [place],                     [address]  Professional        Laboratory           W. Wynona Canes component  Corporation of       [place], PhD,                     [place], [address]        [facility], [place],                     [place], [address]  W. Harlan Stains, PhD, [facility]    Basic metabolic panel     Status: Abnormal   Collection Time: 09/21/22  3:56 PM  Result Value Ref Range   Sodium 127 (L) 135 - 145 mmol/L   Potassium 3.9 3.5 - 5.1 mmol/L   Chloride 96 (L) 98 - 111 mmol/L   CO2 22 22 - 32 mmol/L   Glucose, Bld 154 (H) 70 - 99 mg/dL    Comment: Glucose reference range applies only to samples taken after fasting for at least 8 hours.   BUN 23 (H) 6 - 20 mg/dL   Creatinine, Ser 9.51 0.61 - 1.24 mg/dL   Calcium 8.6 (L) 8.9 - 10.3 mg/dL   GFR, Estimated >88 >41 mL/min    Comment: (NOTE) Calculated using the CKD-EPI Creatinine Equation (2021)    Anion gap 9 5 - 15    Comment: Performed at [facility], [address] [place], [place] [address]  CBC     Status: Abnormal   Collection Time: 09/21/22  3:56 PM  Result Value Ref Range   WBC 11.8 (H) 4.0 - 10.5 K/uL   RBC 4.20 (L) 4.22 - 5.81 MIL/uL   Hemoglobin 12.2 (L) 13.0 - 17.0 g/dL   HCT 01.6 (L) 01.0 - 93.2 %   MCV 87.1 80.0 - 100.0 fL   MCH 29.0 26.0 - 34.0 pg   MCHC 33.3 30.0 - 36.0 g/dL   RDW 35.5 73.2 - 20.2 %   Platelets 245 150 - 400 K/uL   nRBC 0.0 0.0 - 0.2 %    Comment: Performed at [facility], [address] [place], [place] [address]  Troponin I (High Sensitivity)     Status: None   Collection Time: 09/21/22  3:56 PM  Result Value Ref Range   Troponin I (High Sensitivity) 5 <18 ng/L    Comment: (NOTE) Elevated high sensitivity troponin I (hsTnI) values and significant  changes across serial measurements may suggest ACS but many other  chronic and acute conditions are known to elevate hsTnI results.  Refer to the "Links" section for chest pain algorithms and additional  guidance. Performed at [facility], [address], [place], [place] [address]   Magnesium     Status: None   Collection Time:  09/21/22  3:56 PM  Result Value Ref Range   Magnesium 2.0 1.7 - 2.4 mg/dL    Comment: Performed at [facility], [address] [place], [place] [address]  Basic metabolic panel     Status: Abnormal   Collection Time: 09/23/22 12:41 PM  Result Value Ref Range   Sodium 133 (L) 135 - 145 mmol/L   Potassium 3.8 3.5 - 5.1 mmol/L   Chloride 96 (L) 98 - 111 mmol/L   CO2 26 22 - 32 mmol/L   Glucose,  Bld 112 (H) 70 - 99 mg/dL    Comment: Glucose reference range applies only to samples taken after fasting for at least 8 hours.   BUN 20 6 - 20 mg/dL   Creatinine, Ser 9.52 0.61 - 1.24 mg/dL   Calcium 9.4 8.9 - 84.1 mg/dL   GFR, Estimated >32 >44 mL/min    Comment: (NOTE) Calculated using the CKD-EPI Creatinine Equation (2021)    Anion gap 11 5 - 15    Comment: Performed at [facility], [address] [place], [place] [address]  CBC     Status: Abnormal   Collection Time: 09/23/22 12:41 PM  Result Value Ref Range   WBC 11.4 (H) 4.0 - 10.5 K/uL   RBC 4.23 4.22 - 5.81 MIL/uL   Hemoglobin 12.5 (L) 13.0 - 17.0 g/dL   HCT 25.3 66.4 - 40.3 %   MCV 92.2 80.0 - 100.0 fL   MCH 29.6 26.0 - 34.0 pg   MCHC 32.1 30.0 - 36.0 g/dL   RDW 47.4 25.9 - 56.3 %   Platelets 276 150 - 400 K/uL   nRBC 0.0 0.0 - 0.2 %    Comment: Performed at [facility], [address], [place], [place] [address]  Troponin I (High Sensitivity)     Status: Abnormal   Collection Time: 09/23/22 12:41 PM  Result Value Ref Range   Troponin I (High Sensitivity) 54 (H) <18 ng/L    Comment: RESULT CALLED TO, READ BACK BY AND VERIFIED WITH V,LABUNSKIY RN @1353  09/23/22 E,[place] DELTA CHECK NOTED (NOTE) Elevated high sensitivity troponin I (hsTnI) values and significant  changes across serial measurements may suggest ACS but many other  chronic and acute conditions are known to elevate hsTnI results.  Refer to the "Links" section for chest pain algorithms and additional  guidance. Performed at [facility], [address] [place], [place] [address]   Troponin I (High Sensitivity)     Status: Abnormal   Collection Time: 09/23/22  2:41 PM  Result Value Ref Range   Troponin I (High Sensitivity) 41 (H) <18 ng/L    Comment: (NOTE) Elevated high sensitivity troponin I (hsTnI) values and significant  changes across serial measurements may suggest ACS but many other  chronic and acute conditions are known to elevate hsTnI results.  Refer to the "Links" section for chest pain algorithms and additional  guidance. Performed at [facility], [address] [place], [place] [address]   HIV Antibody (routine testing w rflx)     Status: None   Collection Time: 09/23/22  8:46 PM  Result Value Ref Range   HIV Screen 4th Generation wRfx Non Reactive Non Reactive    Comment: Performed at [facility], [address] [place], [place] [address]  CBC     Status: Abnormal   Collection Time: 09/23/22  8:46 PM  Result Value Ref Range   WBC 8.4 4.0 - 10.5 K/uL   RBC 4.33 4.22 - 5.81 MIL/uL   Hemoglobin 12.8 (L) 13.0 - 17.0 g/dL   HCT 01.6 (L) 01.0 - 93.2 %   MCV 87.8 80.0 - 100.0 fL   MCH 29.6 26.0 - 34.0 pg   MCHC 33.7 30.0 - 36.0 g/dL   RDW 35.5 73.2 - 20.2 %   Platelets 294 150 - 400 K/uL   nRBC 0.0 0.0 - 0.2 %    Comment: Performed at [facility], [address] [place],  [place] [address]  Creatinine, serum     Status: None   Collection Time: 09/23/22  8:46 PM  Result Value Ref Range   Creatinine, Ser 1.06 0.61 - 1.24 mg/dL   GFR, Estimated >91 >47 mL/min    Comment: (NOTE) Calculated using the CKD-EPI Creatinine Equation (2021) Performed at [facility], [address], [place], [place] [address]   TSH     Status: None   Collection Time: 09/23/22  8:46 PM  Result Value Ref Range   TSH 1.340 0.350 - 4.500 uIU/mL    Comment: Performed by a 3rd Generation assay with a functional sensitivity of <=0.01 uIU/mL. Performed at [facility], [address] [place], [place] [address]   Basic metabolic panel     Status: Abnormal   Collection Time: 09/24/22  3:18 AM  Result Value Ref Range   Sodium 135 135 - 145 mmol/L   Potassium 4.2 3.5 - 5.1 mmol/L   Chloride 100 98 - 111 mmol/L   CO2 24 22 - 32 mmol/L   Glucose, Bld 124 (H) 70 - 99 mg/dL    Comment: Glucose reference range applies only to samples taken after fasting for at least 8 hours.   BUN 20 6 - 20 mg/dL   Creatinine, Ser 6.57 0.61 - 1.24 mg/dL   Calcium 8.8 (L) 8.9 - 10.3 mg/dL   GFR, Estimated >84 >69 mL/min    Comment: (NOTE) Calculated using the CKD-EPI Creatinine Equation (2021)    Anion gap 11 5 - 15    Comment: Performed at [facility], [address] [place], [place] [address]  ECHOCARDIOGRAM COMPLETE     Status: None   Collection Time: 09/24/22  2:57 PM  Result Value Ref Range   Weight 3,137.6 oz   Height 72 in   BP 150/69 mmHg   S' Lateral 3.30 cm   AR max vel 2.54 cm2   AV Area VTI 2.67 cm2   AV Mean grad 2.0 mmHg   AV Peak grad 5.6 mmHg   Ao pk vel 1.18 m/s   Area-P 1/2 4.21 cm2   AV Area mean vel 2.97 cm2   Est EF 60 - 65%         Garner Nash, MD, MS

## 2022-11-08 ENCOUNTER — Encounter: Payer: Self-pay | Admitting: Internal Medicine

## 2022-12-13 ENCOUNTER — Other Ambulatory Visit: Payer: Self-pay

## 2022-12-13 ENCOUNTER — Telehealth: Payer: Self-pay

## 2022-12-13 ENCOUNTER — Encounter: Payer: Self-pay | Admitting: Internal Medicine

## 2022-12-13 ENCOUNTER — Ambulatory Visit (AMBULATORY_SURGERY_CENTER): Payer: No Typology Code available for payment source

## 2022-12-13 VITALS — Ht 72.0 in | Wt 185.0 lb

## 2022-12-13 DIAGNOSIS — Z86010 Personal history of colonic polyps: Secondary | ICD-10-CM

## 2022-12-13 NOTE — Telephone Encounter (Signed)
Patient was called for PV. [name] initially said that he thought his nurse visit was yesterday however he never called to confirm. Pre-Visit was scheduled for 12:30 Pm today. I asked [name] if he had time to do the visit at this time and he asked how long it would take. I told him that we are scheduled every thirty minutes but I would try to do it in a little less time. Patient said lets get it done now. Five minutes into the visit he abruptly says I have to take someone into the house can you wait for me? I agreed, Patient hung up the phone and would not answer when I called back several minutes later. I called the patient 4 times with no answer. I tried to call back at the end of the day in hopes to finish the visit but he did not pick up. I left a message stating he needed to call before  5:00 today to reschedule PV or his procedure would be cancelled. If patient doesn't call I will send him a no show letter and cancel his procedure. I discussed this matter with our manager [name] and she agreed that he should be considered a no show.   [name], LPN  ( Pre-Visit )

## 2022-12-13 NOTE — Progress Notes (Signed)
Denies allergies to eggs or soy products. Denies complication of anesthesia or sedation. Denies use of weight loss medication. Denies use of O2.   When patient was called for PV. He said that he forgot his appointment, but he could do the visit now. So five minutes into the visit, he abruptly states he had to get someone in the house and for me to hold on for a minute. Patient hung up the phone and never answered the phone again. I will try to reach him after all of the other patients that are scheduled today. If not I will count him as a no show.

## 2022-12-18 ENCOUNTER — Other Ambulatory Visit: Payer: Self-pay | Admitting: Medical Genetics

## 2022-12-18 DIAGNOSIS — Z006 Encounter for examination for normal comparison and control in clinical research program: Secondary | ICD-10-CM

## 2022-12-19 ENCOUNTER — Ambulatory Visit (AMBULATORY_SURGERY_CENTER): Payer: No Typology Code available for payment source

## 2022-12-19 VITALS — Ht 72.0 in | Wt 185.0 lb

## 2022-12-19 DIAGNOSIS — Z86010 Personal history of colonic polyps: Secondary | ICD-10-CM

## 2022-12-19 NOTE — Progress Notes (Addendum)
No egg or soy allergy known to patient  No issues known to pt with past sedation with any surgeries or procedures: possible low HR (30s) with fentanyl may be caused by blood pressure medications patient is unsure happened after a dental surgery Patient denies ever being told they had issues or difficulty with intubation  No FH of Malignant Hyperthermia Pt is not on diet pills Pt is not on  home 02  Pt is not on blood thinners  Pt denies issues with constipation  No A fib or A flutter Have any cardiac testing pending--no  LOA: independent  Prep: Miralax    PV competed with patient. Prep instructions sent via mychart and home address.

## 2022-12-26 ENCOUNTER — Telehealth: Payer: Self-pay | Admitting: Family Medicine

## 2022-12-26 NOTE — Telephone Encounter (Signed)
Faxed successfully

## 2022-12-26 NOTE — Telephone Encounter (Signed)
Tried contacting number below and was transferred and hung up on. Will try again later.

## 2022-12-26 NOTE — Telephone Encounter (Signed)
Tried calling phone number again and was transferred to a voicemail and the call ended before I could leave a message.

## 2022-12-26 NOTE — Telephone Encounter (Signed)
Left patient a detailed voice message advising him to return call to office to discuss what was needed and if there is a better contact number for this company.

## 2022-12-26 NOTE — Telephone Encounter (Signed)
[facility] [phone]  The request was for xrays also and they did not receive them. Please send asap so they can schedule the CT scan.

## 2022-12-26 NOTE — Telephone Encounter (Addendum)
Spoke with pt and he was able to provide me with his team nurse contact number. I reached out to Reymundo Poll, RN at [phone] ext [phone]. She wasn't available and I was transferred to [facility] who provided me with the fax number to fax over the xrays at [phone] making attention to [place], C.   Pt advised via [employer].

## 2022-12-28 NOTE — Telephone Encounter (Signed)
Received incoming fax from [facility] requesting xray lumbar spine images to be faxed to Seleta Rhymes at [phone].  Faxed images successfully.

## 2023-01-03 ENCOUNTER — Encounter: Payer: No Typology Code available for payment source | Admitting: Internal Medicine

## 2023-01-15 ENCOUNTER — Other Ambulatory Visit: Payer: Self-pay | Admitting: Family Medicine

## 2023-01-15 MED ORDER — HYDROCHLOROTHIAZIDE 25 MG PO TABS: 25.0000 mg | ORAL_TABLET | Freq: Every day | ORAL | 0 refills | Status: DC

## 2023-01-23 ENCOUNTER — Encounter: Payer: Self-pay | Admitting: Internal Medicine

## 2023-01-24 ENCOUNTER — Encounter: Payer: No Typology Code available for payment source | Admitting: Internal Medicine

## 2023-02-07 NOTE — Progress Notes (Signed)
[facility] History and Physical   Primary Care Physician:  Garnette Gunner, MD   Reason for Procedure:  History of colon polyps  Plan:    Colonoscopy     HPI: Bronislaus Jeff is a 62 y.o. male here for a surveillance colonoscopy with polyp history as below.  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 Authorizing Provider  ALPRAZolam Prudy Feeler) 0.25 MG tablet Take 0.25 mg by mouth at bedtime as needed for sleep or anxiety.    [provider]  cholecalciferol (VITAMIN D3) 25 MCG (1000 UNIT) tablet Take 2,000 Units by mouth daily. 11/14/20   [provider]  Glucosamine-Chondroitin 750-600 MG TABS Take 1 tablet by mouth daily.    [provider]  hydrochlorothiazide (HYDRODIURIL) 25 MG tablet Take 1 tablet (25 mg total) by mouth daily. 01/15/23 02/14/23  Garnette Gunner, MD  LORazepam (ATIVAN) 0.5 MG tablet Take 0.5 mg by mouth as needed for anxiety or sleep.    [provider]  rosuvastatin (CRESTOR) 5 MG tablet TAKE ONE TABLET BY MOUTH DAILY 08/24/22   Garnette Gunner, MD  zolpidem (AMBIEN) 10 MG tablet Take 10 mg by mouth at bedtime as needed for sleep.    [provider]     Current Outpatient Medications  Medication Sig Dispense Refill   cholecalciferol (VITAMIN D3) 25 MCG (1000 UNIT) tablet Take 2,000 Units by mouth daily.     ELDERBERRY PO Take by mouth daily. Elderberry Syrup     Glucosamine-Chondroitin 750-600 MG TABS Take 1 tablet by mouth daily.     hydrochlorothiazide (HYDRODIURIL) 25 MG tablet Take 1 tablet (25 mg total) by mouth daily. 90 tablet 0   rosuvastatin (CRESTOR) 5 MG tablet TAKE ONE TABLET BY MOUTH DAILY 90 tablet 2   ALPRAZolam (XANAX) 0.25 MG tablet Take 0.25 mg by mouth at bedtime as needed for sleep or anxiety.     LORazepam (ATIVAN) 0.5 MG tablet Take 0.5 mg by mouth as needed for anxiety or sleep.     zolpidem (AMBIEN) 10 MG tablet Take 10 mg by mouth at bedtime as needed for sleep.     Current Facility-Administered Medications  Medication Dose Route Frequency Provider Last Rate Last Admin   0.9 %  sodium chloride infusion  500 mL Intravenous Once Iva Boop, MD        Allergies as of 02/08/2023   (No Known Allergies)    Family History  Adopted: Yes  Problem Relation Age of Onset   Arthritis Mother    Throat cancer  Father        smoked   BRCA 1/2 Sister    Breast cancer Sister    Colon cancer Paternal Grandmother    Pancreatic cancer Neg Hx    Rectal cancer Neg Hx    Stomach cancer Neg Hx     Social History   Socioeconomic History   Marital status: Married    Spouse name: Not on file   Number of children: 2   Years of education: Not on file   Highest education level: Not on file  Occupational History   Occupation: Medical supplies salesman    Employer: [employer]  Tobacco Use   Smoking status: Never   Smokeless tobacco: Never  Vaping Use   Vaping status: Never Used  Substance and Sexual Activity   Alcohol use: Yes    Alcohol/week: 7.0 standard drinks of alcohol    Types: 7 Cans of beer per week    Comment: Daily   Drug use: No   Sexual activity: Yes  Other Topics Concern   Not on file  Social  History Narrative   Not on file   Social Determinants of Health   Financial Resource Strain: Not on file  Food Insecurity: Not on file  Transportation Needs: Not on file  Physical Activity: Not on file  Stress: Not on file  Social Connections: Not on file  Intimate Partner Violence: Not on file    Review of Systems:  All other review of systems negative except as mentioned in the HPI.  Physical Exam: Vital signs BP 120/70   Pulse (!) 59   Temp (!) 97.5 F (36.4 C) (Temporal)   Ht 6' (1.829 m)   Wt 185 lb (83.9 kg)   SpO2 100%   BMI 25.09 kg/m   General:   Alert,  Well-developed, well-nourished, pleasant and cooperative in NAD Lungs:  Clear throughout to auscultation.   Heart:  Regular rate and rhythm; no murmurs, clicks, rubs,  or gallops. Abdomen:  Soft, nontender and nondistended. Normal bowel sounds.   Neuro/Psych:  Alert and cooperative. Normal mood and affect. A and O x 3   @Meyli Boice  Sena Slate, MD, [facility] [phone] (pager) 02/08/2023 11:57 AM@

## 2023-02-08 ENCOUNTER — Ambulatory Visit (AMBULATORY_SURGERY_CENTER): Payer: No Typology Code available for payment source | Admitting: Internal Medicine

## 2023-02-08 ENCOUNTER — Encounter: Payer: Self-pay | Admitting: Internal Medicine

## 2023-02-08 VITALS — BP 109/58 | HR 45 | Temp 97.5°F | Resp 12 | Ht 72.0 in | Wt 185.0 lb

## 2023-02-08 DIAGNOSIS — Z860100 Personal history of colon polyps, unspecified: Secondary | ICD-10-CM

## 2023-02-08 DIAGNOSIS — D122 Benign neoplasm of ascending colon: Secondary | ICD-10-CM | POA: Diagnosis not present

## 2023-02-08 DIAGNOSIS — Z09 Encounter for follow-up examination after completed treatment for conditions other than malignant neoplasm: Secondary | ICD-10-CM

## 2023-02-08 MED ORDER — SODIUM CHLORIDE 0.9 % IV SOLN: 500.0000 mL | Freq: Once | INTRAVENOUS | Status: DC

## 2023-02-08 NOTE — Patient Instructions (Addendum)
There was a very tiny polyp found ad removed (1 mm). I will let you know pathology results and when to have another routine colonoscopy by mail and/or My Chart.  Hemorrhoids were swollen - common after colonoscopy prep.  I appreciate the opportunity to care for you. Iva Boop, MD, [facility]  Handouts provided on polyps and hemorrhoids.  Resume previous diet.  Continue present medications.  Repeat colonoscopy is recommended. The colonoscopy date will be determined after pathology results from today's exam become available for review.   YOU HAD AN ENDOSCOPIC PROCEDURE TODAY AT THE [facility]:   Refer to the procedure report that was given to you for any specific questions about what was found during the examination.  If the procedure report does not answer your questions, please call your gastroenterologist to clarify.  If you requested that your care partner not be given the details of your procedure findings, then the procedure report has been included in a sealed envelope for you to review at your convenience later.  YOU SHOULD EXPECT: Some feelings of bloating in the abdomen. Passage of more gas than usual.  Walking can help get rid of the air that was put into your GI tract during the procedure and reduce the bloating. If you had a lower endoscopy (such as a colonoscopy or flexible sigmoidoscopy) you may notice spotting of blood in your stool or on the toilet paper. If you underwent a bowel prep for your procedure, you may not have a normal bowel movement for a few days.  Please Note:  You might notice some irritation and congestion in your nose or some drainage.  This is from the oxygen used during your procedure.  There is no need for concern and it should clear up in a day or so.  SYMPTOMS TO REPORT IMMEDIATELY:  Following lower endoscopy (colonoscopy or flexible sigmoidoscopy):  Excessive amounts of blood in the stool  Significant tenderness or worsening of abdominal  pains  Swelling of the abdomen that is new, acute  Fever of 100F or higher  For urgent or emergent issues, a gastroenterologist can be reached at any hour by calling [phone]. Do not use MyChart messaging for urgent concerns.    DIET:  We do recommend a small meal at first, but then you may proceed to your regular diet.  Drink plenty of fluids but you should avoid alcoholic beverages for 24 hours.  ACTIVITY:  You should plan to take it easy for the rest of today and you should NOT DRIVE or use heavy machinery until tomorrow (because of the sedation medicines used during the test).    FOLLOW UP: Our staff will call the number listed on your records the next business day following your procedure.  We will call around 7:15- 8:00 am to check on you and address any questions or concerns that you may have regarding the information given to you following your procedure. If we do not reach you, we will leave a message.     If any biopsies were taken you will be contacted by phone or by letter within the next 1-3 weeks.  Please call us at [phone] if you have not heard about the biopsies in 3 weeks.    SIGNATURES/CONFIDENTIALITY: You and/or your care partner have signed paperwork which will be entered into your electronic medical record.  These signatures attest to the fact that that the information above on your After Visit Summary has been reviewed and is understood.  Full responsibility of the confidentiality of this discharge information lies with you and/or your care-partner.

## 2023-02-08 NOTE — Op Note (Signed)
[facility] Patient Name: Louis Lamb Procedure Date: 02/08/2023 11:46 AM MRN: [id number] Endoscopist: Iva Boop , MD, [id number] Age: 62 Referring MD:  Date of Birth: [birth year] Gender: Male Account #: [id number] Procedure:                Colonoscopy Indications:              Surveillance: Personal history of adenomatous                            polyps on last colonoscopy > 5 years ago, Last                            colonoscopy: 2023 Medicines:                Monitored Anesthesia Care Procedure:                Pre-Anesthesia Assessment:                           - Prior to the procedure, a History and Physical                            was performed, and patient medications and                            allergies were reviewed. The patient's tolerance of                            previous anesthesia was also reviewed. The risks                            and benefits of the procedure and the sedation                            options and risks were discussed with the patient.                            All questions were answered, and informed consent                            was obtained. Prior Anticoagulants: The patient has                            taken no anticoagulant or antiplatelet agents. ASA                            Grade Assessment: II - A patient with mild systemic                            disease. After reviewing the risks and benefits,                            the patient was deemed in satisfactory condition to  undergo the procedure.                           After obtaining informed consent, the colonoscope                            was passed under direct vision. Throughout the                            procedure, the patient's blood pressure, pulse, and                            oxygen saturations were monitored continuously. The                            Olympus CF-HQ190L ([id number]) Colonoscope was                             introduced through the anus and advanced to the the                            cecum, identified by appendiceal orifice and                            ileocecal valve. The colonoscopy was performed                            without difficulty. The patient tolerated the                            procedure well. The quality of the bowel                            preparation was good. The ileocecal valve,                            appendiceal orifice, and rectum were photographed.                            The bowel preparation used was Miralax via split                            dose instruction. Scope In: 12:04:07 PM Scope Out: 12:16:44 PM Scope Withdrawal Time: 0 hours 9 minutes 4 seconds  Total Procedure Duration: 0 hours 12 minutes 37 seconds  Findings:                 The perianal and digital rectal examinations were                            normal.                           A 1 mm polyp was found in the ascending colon. The  polyp was sessile. The polyp was removed with a                            cold biopsy forceps. Resection and retrieval were                            complete. Verification of patient identification                            for the specimen was done. Estimated blood loss was                            minimal.                           External and internal hemorrhoids were found.                           Anal papilla(e) were hypertrophied.                           The exam was otherwise without abnormality on                            direct and retroflexion views. Complications:            No immediate complications. Estimated Blood Loss:     Estimated blood loss was minimal. Impression:               - One 1 mm polyp in the ascending colon, removed                            with a cold biopsy forceps. Resected and retrieved.                           - External and internal hemorrhoids.                            - Anal papilla(e) were hypertrophied.                           - The examination was otherwise normal on direct                            and retroflexion views.                           - Personal history of colonic polyps. 10/2012 - 2                            sessile serrated adenomas and 1 tubular adenoma                           03/15/2017 no polyps Recommendation:           - Patient has a contact number available for  emergencies. The signs and symptoms of potential                            delayed complications were discussed with the                            patient. Return to normal activities tomorrow.                            Written discharge instructions were provided to the                            patient.                           - Resume previous diet.                           - Continue present medications.                           - Repeat colonoscopy is recommended. The                            colonoscopy date will be determined after pathology                            results from today's exam become available for                            review. Iva Boop, MD 02/08/2023 12:25:33 PM This report has been signed electronically.

## 2023-02-08 NOTE — Progress Notes (Signed)
Called to room to assist during endoscopic procedure.  Patient ID and intended procedure confirmed with present staff. Received instructions for my participation in the procedure from the performing physician.  

## 2023-02-08 NOTE — Progress Notes (Signed)
Vss nad trans to pacu 

## 2023-02-08 NOTE — Progress Notes (Signed)
Pt's states no medical or surgical changes since previsit or office visit. 

## 2023-02-11 ENCOUNTER — Telehealth: Payer: Self-pay

## 2023-02-11 NOTE — Telephone Encounter (Signed)
  Follow up Call-     02/08/2023   11:16 AM  Call back number  Post procedure Call Back phone  # [phone]  Permission to leave phone message Yes     Patient questions:  Do you have a fever, pain , or abdominal swelling? No. Pain Score  0 *  Have you tolerated food without any problems? Yes.    Have you been able to return to your normal activities? Yes.    Do you have any questions about your discharge instructions: Diet   No. Medications  No. Follow up visit  No.  Do you have questions or concerns about your Care? No.  Actions: * If pain score is 4 or above: No action needed, pain <4.

## 2023-02-13 ENCOUNTER — Encounter: Payer: Self-pay | Admitting: Internal Medicine

## 2023-02-13 ENCOUNTER — Other Ambulatory Visit: Payer: Self-pay

## 2023-02-13 ENCOUNTER — Other Ambulatory Visit: Payer: No Typology Code available for payment source

## 2023-02-13 DIAGNOSIS — I10 Essential (primary) hypertension: Secondary | ICD-10-CM

## 2023-02-13 LAB — SURGICAL PATHOLOGY

## 2023-02-21 ENCOUNTER — Other Ambulatory Visit: Payer: Self-pay | Admitting: Family Medicine

## 2023-04-04 ENCOUNTER — Other Ambulatory Visit (HOSPITAL_COMMUNITY)
Admission: RE | Admit: 2023-04-04 | Discharge: 2023-04-04 | Disposition: A | Discharge status: Home / Self Care | Payer: Self-pay | Source: Ambulatory Visit | Attending: Oncology | Admitting: Oncology

## 2023-04-04 DIAGNOSIS — Z006 Encounter for examination for normal comparison and control in clinical research program: Secondary | ICD-10-CM | POA: Insufficient documentation

## 2023-04-15 LAB — GENECONNECT MOLECULAR SCREEN: Genetic Analysis Overall Interpretation: NEGATIVE

## 2023-05-25 ENCOUNTER — Other Ambulatory Visit: Payer: Self-pay | Admitting: Family Medicine

## 2023-05-25 DIAGNOSIS — E785 Hyperlipidemia, unspecified: Secondary | ICD-10-CM

## 2023-05-28 ENCOUNTER — Other Ambulatory Visit: Payer: Self-pay | Admitting: Family Medicine

## 2023-06-15 ENCOUNTER — Other Ambulatory Visit: Payer: Self-pay

## 2023-06-15 ENCOUNTER — Ambulatory Visit
Admission: EM | Admit: 2023-06-15 | Discharge: 2023-06-15 | Disposition: A | Discharge status: Home / Self Care | Attending: Internal Medicine | Admitting: Internal Medicine

## 2023-06-15 ENCOUNTER — Encounter: Payer: Self-pay | Admitting: Emergency Medicine

## 2023-06-15 DIAGNOSIS — M7712 Lateral epicondylitis, left elbow: Secondary | ICD-10-CM

## 2023-06-15 MED ORDER — PREDNISONE 20 MG PO TABS: 40.0000 mg | ORAL_TABLET | Freq: Every day | ORAL | 0 refills | Status: DC

## 2023-06-15 MED ORDER — PREDNISONE 20 MG PO TABS: 40.0000 mg | ORAL_TABLET | Freq: Every day | ORAL | 0 refills | Status: AC

## 2023-06-15 NOTE — ED Provider Notes (Signed)
 [facility]    CSN: [id number] Arrival date & time: 06/15/23  1249      History   Chief Complaint Chief Complaint  Patient presents with   Arm Pain    HPI Terre Zabriskie is a 63 y.o. male.   Patient presents to urgent care for evaluation of pain to the left lateral elbow that started approximately 1 to 2 weeks ago and worsened yesterday after he reached out his left arm to catch an object.  Reports pain is worsened by gripping motion of the left hand, supination of the left forearm to check the time on his watch, and extending the left wrist.  Nothing makes pain better.  Pain woke him up out of his sleep last night at 4 AM.  He has a difficult time describing pain and states it "just hurts and aches".  Denies burning or sharp pain to the lateral forearm. He has taken tylenol and applied lidocaine patch/voltaren gel to the area without much relief in pain.    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   Followed as Primary Care Patient/ [facility]/ [facility]  - L thigh     Obesity    Personal history of colonic adenomas 10/20/2012   RBBB (right bundle branch block with left anterior fascicular block)     Patient Active Problem List   Diagnosis Date Noted   Hospital discharge follow-up 10/04/2022   Sick sinus syndrome (HCC) 10/04/2022   Symptomatic bradycardia 09/23/2022   Right-sided chest wall pain 08/21/2022   Hyperkalemia 08/21/2022   Right elbow pain 08/21/2022   Acute cough 02/08/2022   Family history of BRCA gene positive 02/08/2022   Pes planovalgus, acquired, left 08/08/2021   Tinnitus of both ears 08/08/2021   Insomnia 08/08/2021   Chronic right-sided thoracic back pain 08/08/2021   Paresthesia 07/03/2019   History of colonic polyps 10/20/2012   Screening  for malignant neoplasm of prostate 09/04/2012   Hyperlipidemia 08/17/2011   Essential hypertension, benign 07/23/2011   Attention deficit disorder 09/28/2009   Rhinitis, allergic 04/23/2007    Past Surgical History:  Procedure Laterality Date   COLONOSCOPY     2014 (polyps)  and 2018 (no polyps)   LASIK     LIPOMA EXCISION     On back   Tooth extraction with bone graft  09/20/2022       Home Medications    Prior to Admission medications   Medication Sig Start Date End Date Taking? Authorizing Provider  predniSONE (DELTASONE) 20 MG tablet Take 2 tablets (40 mg total) by mouth daily with breakfast for 5 days. 06/15/23 06/20/23 Yes Carlisle Beers, FNP  predniSONE (DELTASONE) 20 MG tablet Take 2 tablets (40 mg total) by mouth daily with breakfast for 5 days. 06/15/23 06/20/23 Yes Annai Heick, Donavan Burnet, FNP  ALPRAZolam Prudy Feeler) 0.25 MG tablet Take 0.25 mg by mouth at bedtime as needed for sleep or anxiety.    [provider]  cholecalciferol (VITAMIN D3) 25 MCG (1000 UNIT) tablet Take 2,000 Units by mouth daily. 11/14/20   [provider]  ELDERBERRY PO Take by mouth daily. Elderberry Syrup    [provider]  Glucosamine-Chondroitin 750-600 MG TABS Take 1 tablet by mouth daily.  [provider]  hydrochlorothiazide (HYDRODIURIL) 25 MG tablet TAKE 1 TABLET (25 MG TOTAL) BY MOUTH DAILY 05/25/23 06/24/23  Garnette Gunner, MD  LORazepam (ATIVAN) 0.5 MG tablet Take 0.5 mg by mouth as needed for anxiety or sleep.    [provider]  rosuvastatin (CRESTOR) 5 MG tablet TAKE ONE TABLET BY MOUTH DAILY 05/25/23   Garnette Gunner, MD  zolpidem (AMBIEN) 10 MG tablet Take 10 mg by mouth at bedtime as needed for sleep.    [provider]    Family History Family History  Adopted: Yes  Problem Relation Age of Onset   Arthritis Mother    Throat cancer Father        smoked   BRCA 1/2 Sister    Breast cancer Sister    Colon cancer Paternal  Grandmother    Pancreatic cancer Neg Hx    Rectal cancer Neg Hx    Stomach cancer Neg Hx     Social History Social History   Tobacco Use   Smoking status: Never   Smokeless tobacco: Never  Vaping Use   Vaping status: Never Used  Substance Use Topics   Alcohol use: Yes    Alcohol/week: 7.0 standard drinks of alcohol    Types: 7 Cans of beer per week    Comment: Daily   Drug use: No     Allergies   Patient has no known allergies.   Review of Systems Review of Systems Per HPI  Physical Exam Triage Vital Signs ED Triage Vitals  Encounter Vitals Group     BP 06/15/23 1422 (!) 143/79     Systolic BP Percentile --      Diastolic BP Percentile --      Pulse Rate 06/15/23 1422 60     Resp 06/15/23 1422 16     Temp 06/15/23 1422 98 F (36.7 C)     Temp Source 06/15/23 1422 Oral     SpO2 06/15/23 1422 100 %     Weight --      Height --      Head Circumference --      Peak Flow --      Pain Score 06/15/23 1423 5     Pain Loc --      Pain Education --      Exclude from Growth Chart --    No data found.  Updated Vital Signs BP (!) 143/79 (BP Location: Right Arm)   Pulse 60   Temp 98 F (36.7 C) (Oral)   Resp 16   SpO2 100%   Visual Acuity Right Eye Distance:   Left Eye Distance:   Bilateral Distance:    Right Eye Near:   Left Eye Near:    Bilateral Near:     Physical Exam Vitals and nursing note reviewed.  Constitutional:      Appearance: He is not ill-appearing or toxic-appearing.  HENT:     Head: Normocephalic and atraumatic.     Right Ear: Hearing and external ear normal.     Left Ear: Hearing and external ear normal.     Nose: Nose normal.     Mouth/Throat:     Lips: Pink.  Eyes:     General: Lids are normal. Vision grossly intact. Gaze aligned appropriately.     Extraocular Movements: Extraocular movements intact.     Conjunctiva/sclera: Conjunctivae normal.  Pulmonary:     Effort: Pulmonary effort is normal.  Musculoskeletal:     Right  elbow: Normal.     Left elbow: Normal. No swelling. No tenderness.     Left forearm: Tenderness (TTP over the left proximal lateral forearm) present. No swelling, edema, deformity, lacerations or bony tenderness.     Right wrist: Normal.     Left wrist: Normal.     Cervical back: Neck supple.     Comments: 5/5 grip strength to bilateral upper extremities, normal sensation distally to bilateral upper extremities. +2 left radial pulse.  Skin:    General: Skin is warm and dry.     Capillary Refill: Capillary refill takes less than 2 seconds.     Findings: No rash.  Neurological:     General: No focal deficit present.     Mental Status: He is alert and oriented to person, place, and time. Mental status is at baseline.     Cranial Nerves: No dysarthria or facial asymmetry.  Psychiatric:        Mood and Affect: Mood normal.        Speech: Speech normal.        Behavior: Behavior normal.        Thought Content: Thought content normal.        Judgment: Judgment normal.      UC Treatments / Results  Labs (all labs ordered are listed, but only abnormal results are displayed) Labs Reviewed - No data to display  EKG   Radiology No results found.  Procedures Procedures (including critical care time)  Medications Ordered in UC Medications - No data to display  Initial Impression / Assessment and Plan / UC Course  I have reviewed the triage vital signs and the nursing notes.  Pertinent labs & imaging results that were available during my care of the patient were reviewed by me and considered in my medical decision making (see chart for details).   1. Lateral epicondylitis Presentation consistent with lateral epicondylitis. He is not a candidate for NSAID therapy due to history of HTN.  Symptoms have not responded well to conservative treatment with tylenol, lidocaine patch, and voltaren, therefore prednisone burst ordered. Low suspicion for acute bony abnormality given atraumatic  mechanism of injury, therefore deferred imaging of the arm. Recommend use of tennis elbow brace, patient states he has this available at home and will wear for support/stability. RICE recommended. Follow-up with orthopedics as needed.  Counseled patient on potential for adverse effects with medications prescribed/recommended today, strict ER and return-to-clinic precautions discussed, patient verbalized understanding.    Final Clinical Impressions(s) / UC Diagnoses   Final diagnoses:  Lateral epicondylitis of left elbow     Discharge Instructions      You have lateral epicondylitis which is inflammation of the tendons to the outside of your elbow.  Take prednisone 40 mg once daily for the next 5 days to treat inflammation and pain. Take prednisone with food to avoid stomach upset.  Wear a tennis elbow brace to provide compression and stability/support to the elbow.  Please schedule a follow-up appointment with your primary care provider and/or an orthopedic provider as needed for ongoing evaluation.  Rest, elevate, and ice your elbow.     ED Prescriptions     Medication Sig Dispense Auth. Provider   predniSONE (DELTASONE) 20 MG tablet Take 2 tablets (40 mg total) by mouth daily with breakfast for 5 days. 10 tablet Reita May M, FNP   predniSONE (DELTASONE) 20 MG tablet Take 2 tablets (40 mg total) by mouth daily with breakfast for 5 days.  10 tablet Carlisle Beers, FNP      PDMP not reviewed this encounter.   Carlisle Beers, [place] 06/15/23 2100

## 2023-06-15 NOTE — Discharge Instructions (Addendum)
 You have lateral epicondylitis which is inflammation of the tendons to the outside of your elbow.  Take prednisone 40 mg once daily for the next 5 days to treat inflammation and pain. Take prednisone with food to avoid stomach upset.  Wear a tennis elbow brace to provide compression and stability/support to the elbow.  Please schedule a follow-up appointment with your primary care provider and/or an orthopedic provider as needed for ongoing evaluation.  Rest, elevate, and ice your elbow.

## 2023-06-15 NOTE — ED Triage Notes (Signed)
 Pt states for a  1.5 weeks he is been having right arm pain, mostly with movement. Denies any fall or injury states he tried to catch something yesterday and that aggravated his pain.

## 2023-08-21 ENCOUNTER — Other Ambulatory Visit: Payer: Self-pay | Admitting: Family Medicine

## 2023-08-26 ENCOUNTER — Other Ambulatory Visit: Payer: Self-pay | Admitting: Family Medicine

## 2023-08-26 NOTE — Telephone Encounter (Unsigned)
 Copied from CRM [id number]. Topic: Clinical - Medication Refill >> Aug 26, 2023  4:55 PM Abigail D wrote: Medication: hydrochlorothiazide  (HYDRODIURIL ) 25 MG tablet  Has the patient contacted their pharmacy? Yes (Agent: If no, request that the patient contact the pharmacy for the refill. If patient does not wish to contact the pharmacy document the reason why and proceed with request.) (Agent: If yes, when and what did the pharmacy advise?)  This is the patient's preferred pharmacy:  [facility] - [place], [place] - [address] [facility] [address] [place] [address] Phone: [phone] Fax: [phone]  Is this the correct pharmacy for this prescription? Yes If no, delete pharmacy and type the correct one.   Has the prescription been filled recently? Yes  Is the patient out of the medication? No  Has the patient been seen for an appointment in the last year OR does the patient have an upcoming appointment? Yes  Can we respond through MyChart? Yes  Agent: Please be advised that Rx refills may take up to 3 business days. We ask that you follow-up with your pharmacy.

## 2023-08-26 NOTE — Telephone Encounter (Signed)
 Last Fill: 05/25/23  Last OV: 11/02/22 Next OV: 09/30/23  Routing to provider for review/authorization.

## 2023-08-28 MED ORDER — HYDROCHLOROTHIAZIDE 25 MG PO TABS: 25.0000 mg | ORAL_TABLET | Freq: Every day | ORAL | 0 refills | Status: DC

## 2023-09-02 ENCOUNTER — Ambulatory Visit (INDEPENDENT_AMBULATORY_CARE_PROVIDER_SITE_OTHER): Admitting: Nurse Practitioner

## 2023-09-02 ENCOUNTER — Encounter: Payer: Self-pay | Admitting: Nurse Practitioner

## 2023-09-02 VITALS — BP 138/80 | HR 63 | Temp 97.1°F | Ht 72.0 in | Wt 189.8 lb

## 2023-09-02 DIAGNOSIS — J40 Bronchitis, not specified as acute or chronic: Secondary | ICD-10-CM | POA: Diagnosis not present

## 2023-09-02 DIAGNOSIS — G8929 Other chronic pain: Secondary | ICD-10-CM | POA: Diagnosis not present

## 2023-09-02 DIAGNOSIS — M546 Pain in thoracic spine: Secondary | ICD-10-CM | POA: Diagnosis not present

## 2023-09-02 MED ORDER — BENZONATATE 100 MG PO CAPS
100.0000 mg | ORAL_CAPSULE | Freq: Three times a day (TID) | ORAL | 0 refills | Status: AC | PRN
Start: 2023-09-02 — End: ?

## 2023-09-02 MED ORDER — HYDROCOD POLI-CHLORPHE POLI ER 10-8 MG/5ML PO SUER: 5.0000 mL | Freq: Two times a day (BID) | ORAL | 0 refills | Status: AC | PRN

## 2023-09-02 MED ORDER — PREDNISONE 20 MG PO TABS: 40.0000 mg | ORAL_TABLET | Freq: Every day | ORAL | 0 refills | Status: DC

## 2023-09-02 NOTE — Progress Notes (Signed)
 Acute Office Visit  Subjective:     Patient ID: [facility], male    DOB: Dec 14, 1960, 63 y.o.   MRN: [id number]  Chief Complaint  Patient presents with   URI    Just wants to be seen for the uri that has been going on for about 3 weeks has been coming and going worst over the past 5 days    HPI  History of Present Illness   Louis Lamb is a 63 year old male who presents with a persistent cough and upper respiratory symptoms.  He has experienced a cough with upper respiratory drainage for two to three weeks, worsening in the last week. The cough is productive and causes shortness of breath during coughing spells, with a sensation of choking. He also has nasal congestion and rhinorrhea, along with fatigue. He denies fever, body aches, chills, ear pain, chest pain, or sore throat.  He frequently uses Simply Saline mist spray and tried Mucinex DM, which increased congestion and impaired breathing, affecting his sleep. A Sudafed 12-hour tablet taken last week did not improve his sleep. He maintains hydration by drinking water.  His wife had similar symptoms and was prescribed a cough medicine that was effective for her. He is not seeking the same medication but notes the similarity in his symptoms. 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   He has also been experiencing mid back pain for the last few years. 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 He is currently going to start with a chiropractor. He states the pain is about an inch to the right of his spine that is sharp, and then he will sometimes have another dull ache in the right rib. This is intermittent and tends to go away with laying down.      ROS See pertinent positives and negatives per HPI.     Objective:    BP 138/80 (BP Location: Left Arm, Patient Position: Sitting, Cuff Size: Small)   Pulse 63   Temp (!) 97.1 F  (36.2 C) (Temporal)   Ht 6' (1.829 m)   Wt 189 lb 12.8 oz (86.1 kg)   SpO2 99%   BMI 25.74 kg/m    Physical Exam Vitals and nursing note reviewed.  Constitutional:      Appearance: Normal appearance.  HENT:     Head: Normocephalic.     Right Ear: Tympanic membrane, ear canal and external ear normal.     Left Ear: Tympanic membrane, ear canal and external ear normal.  Eyes:     Conjunctiva/sclera: Conjunctivae normal.  Cardiovascular:     Rate and Rhythm: Normal rate and regular rhythm.     Pulses: Normal pulses.     Heart sounds: Normal heart sounds.  Pulmonary:     Effort: Pulmonary effort is normal.     Breath sounds: Normal breath sounds.  Musculoskeletal:        General: Tenderness (mid thoracic back to right of spine) present.     Cervical back: Normal range of motion and neck supple. No tenderness.  Lymphadenopathy:     Cervical: No cervical adenopathy.  Skin:    General: Skin is warm.  Neurological:     General: No focal deficit present.     Mental Status: He is alert and oriented to person, place, and time.  Psychiatric:        Mood and Affect: Mood normal.  Behavior: Behavior normal.        Thought Content: Thought content normal.        Judgment: Judgment normal.       Assessment & Plan:   Problem List Items Addressed This Visit       Respiratory   Bronchitis - Primary   Acute bronchitis presents with productive cough, nasal congestion, and fatigue, without fever or signs of pneumonia. Prescribe prednisone , 40mg  daily in the morning with food for 5 days. Use Tussionex cough syrup at night. Offer Tessalon  perles for daytime use, 3 times a day as needed. Advise hydration and rest.        Other   Chronic right-sided thoracic back pain   Chronic back pain with mild spondylitis and narrowing persists without relief from massage or physical therapy. Chiropractic treatment is being explored. Stress and core strengthening were discussed. Provide back  stretches for home. Continue chiropractic treatment. Consider MRI of thoracic spine if symptoms persist.       Relevant Medications   predniSONE  (DELTASONE ) 20 MG tablet    Meds ordered this encounter  Medications   chlorpheniramine-HYDROcodone  (TUSSIONEX) 10-8 MG/5ML    Sig: Take 5 mLs by mouth every 12 (twelve) hours as needed for cough.    Dispense:  70 mL    Refill:  0   benzonatate  (TESSALON ) 100 MG capsule    Sig: Take 1 capsule (100 mg total) by mouth 3 (three) times daily as needed for cough.    Dispense:  30 capsule    Refill:  0   predniSONE  (DELTASONE ) 20 MG tablet    Sig: Take 2 tablets (40 mg total) by mouth daily with breakfast.    Dispense:  10 tablet    Refill:  0    Return if symptoms worsen or fail to improve.  Odette Benjamin, NP

## 2023-09-02 NOTE — Assessment & Plan Note (Signed)
 Chronic back pain with mild spondylitis and narrowing persists without relief from massage or physical therapy. Chiropractic treatment is being explored. Stress and core strengthening were discussed. Provide back stretches for home. Continue chiropractic treatment. Consider MRI of thoracic spine if symptoms persist.

## 2023-09-02 NOTE — Patient Instructions (Signed)
 It was great to see you!  Start tussionex twice a day as needed for cough - this may make you sleepy  Take tessalon  3 times a day as needed for cough  Start prednisone  2 tablets daily in the morning with food for 5 days.   Let's follow-up if symptoms worsen or don't improve.   Take care,  Rheba Cedar, NP

## 2023-09-02 NOTE — Assessment & Plan Note (Signed)
 Acute bronchitis presents with productive cough, nasal congestion, and fatigue, without fever or signs of pneumonia. Prescribe prednisone , 40mg  daily in the morning with food for 5 days. Use Tussionex cough syrup at night. Offer Tessalon  perles for daytime use, 3 times a day as needed. Advise hydration and rest.

## 2023-09-30 ENCOUNTER — Ambulatory Visit (INDEPENDENT_AMBULATORY_CARE_PROVIDER_SITE_OTHER): Admitting: Family Medicine

## 2023-09-30 ENCOUNTER — Encounter: Payer: Self-pay | Admitting: Family Medicine

## 2023-09-30 VITALS — BP 124/72 | HR 63 | Temp 98.1°F | Ht 72.0 in | Wt 191.2 lb

## 2023-09-30 DIAGNOSIS — I495 Sick sinus syndrome: Secondary | ICD-10-CM

## 2023-09-30 DIAGNOSIS — E7800 Pure hypercholesterolemia, unspecified: Secondary | ICD-10-CM

## 2023-09-30 DIAGNOSIS — E785 Hyperlipidemia, unspecified: Secondary | ICD-10-CM

## 2023-09-30 DIAGNOSIS — Z1159 Encounter for screening for other viral diseases: Secondary | ICD-10-CM

## 2023-09-30 DIAGNOSIS — Z0000 Encounter for general adult medical examination without abnormal findings: Secondary | ICD-10-CM | POA: Diagnosis not present

## 2023-09-30 DIAGNOSIS — M546 Pain in thoracic spine: Secondary | ICD-10-CM | POA: Diagnosis not present

## 2023-09-30 DIAGNOSIS — G8929 Other chronic pain: Secondary | ICD-10-CM

## 2023-09-30 DIAGNOSIS — Z23 Encounter for immunization: Secondary | ICD-10-CM

## 2023-09-30 DIAGNOSIS — Z125 Encounter for screening for malignant neoplasm of prostate: Secondary | ICD-10-CM

## 2023-09-30 DIAGNOSIS — I10 Essential (primary) hypertension: Secondary | ICD-10-CM

## 2023-09-30 DIAGNOSIS — R5382 Chronic fatigue, unspecified: Secondary | ICD-10-CM

## 2023-09-30 MED ORDER — HYDROCHLOROTHIAZIDE 25 MG PO TABS: 25.0000 mg | ORAL_TABLET | Freq: Every day | ORAL | 0 refills | Status: DC

## 2023-09-30 MED ORDER — ROSUVASTATIN CALCIUM 5 MG PO TABS: 5.0000 mg | ORAL_TABLET | Freq: Every day | ORAL | 1 refills | Status: AC

## 2023-09-30 NOTE — Progress Notes (Signed)
 Assessment  Assessment/Plan:  Assessment and Plan Assessment & Plan Chronic Back Pain Chronic back pain is intermittent, located in two spots, and unpredictable. Previous imaging showed mild lumbar arthritis and spondylosis. Prednisone  did not alleviate the pain, suggesting it may not be due to nerve compression. He is hesitant about chiropractic treatment due to its demands and uncertain efficacy. Further imaging and specialist consultation are necessary to identify the underlying cause. - Refer to a spinal specialist for further evaluation - Consider additional imaging, possibly higher than previous lumbar MRI - Discuss potential referral to neurosurgery or neurology for further assessment  Hypertension Hypertension is well-controlled on hydrochlorothiazide  25 mg daily. Blood pressure readings are generally in the 120s, with occasional readings in the 130s, possibly stress-related. No chest pain or shortness of breath reported. Current guidelines suggest maintaining blood pressure under 130/80 mmHg, which he is achieving. - Continue hydrochlorothiazide  25 mg daily - Monitor blood pressure regularly  General Health Maintenance He is interested in completing vaccinations while he is covered by insurance. The hepatitis C treatment is 95% effective with minimal side effects, and the pneumonia vaccine is recommended for its respiratory protection benefits. - Order lab work including thyroid , cholesterol, blood sugar, A1c, urine microalbumin, vitamin D, testosterone, and cotinine levels - Administer shingles vaccine - Administer pneumonia vaccine - Offer hepatitis C screening - Check PSA levels - Check magnesium levels - Discuss potential RSV vaccine at pharmacy     Medications Discontinued During This Encounter  Medication Reason   predniSONE  (DELTASONE ) 20 MG tablet    cholecalciferol (VITAMIN D3) 25 MCG (1000 UNIT) tablet    rosuvastatin  (CRESTOR ) 5 MG tablet Reorder    hydrochlorothiazide  (HYDRODIURIL ) 25 MG tablet Reorder    Patient Counseling(The following topics were reviewed and/or handout was given):  -Nutrition: Stressed importance of moderation in sodium/caffeine intake, saturated fat and cholesterol, caloric balance, sufficient intake of fresh fruits, vegetables, and fiber.  -Stressed the importance of regular exercise.   -Substance Abuse: Discussed cessation/primary prevention of tobacco, alcohol, or other drug use; driving or other dangerous activities under the influence; availability of treatment for abuse.   -Injury prevention: Discussed safety belts, safety helmets, smoke detector, smoking near bedding or upholstery.   -Sexuality: Discussed sexually transmitted diseases, partner selection, use of condoms, avoidance of unintended pregnancy and contraceptive alternatives.   -Dental health: Discussed importance of regular tooth brushing, flossing, and dental visits.  -Health maintenance and immunizations reviewed. Please refer to Health maintenance section.  Return in about 1 year (around 09/29/2024) for physical (fasting labs).        Subjective:   Encounter date: 09/30/2023  Chief Complaint  Patient presents with   Annual Exam    Not fasting    Back Pain    Ongoing from last year    Discussed the use of AI scribe software for clinical note transcription with the patient, who gave verbal consent to proceed.  History of Present Illness Louis Lamb is a 63 year old male who presents for a physical exam.  He has hypertension, well controlled on hydrochlorothiazide  25 mg daily, with blood pressure readings generally in the 120s, though recently closer to 130, which he attributes to stress. No chest pain or shortness of breath.  He experiences chronic back pain that is intermittent and occurs in two unpredictable spots. The pain can be intense but does not last long. A previous course of prednisone  did not improve the back pain.  He has consulted a chiropractor but has  not committed to a treatment regimen. He has not seen a neurosurgeon or spinal orthopedist for this issue, though he has had imaging in the past.  He had bronchitis about a month ago and was treated with cough syrup and prednisone , which improved his symptoms. He still has some congestion and drainage.  He was previously taking vitamin D for neuropathy in his feet, based on a suggestion from a neurology contact, but has discontinued it as it did not seem to help. He is currently taking magnesium, which he believes may be helping with sleep.  He has experienced significant stress due to personal circumstances, including the sudden death of a friend's spouse and managing the affairs of a college roommate with Parkinson's disease. He is also dealing with changes at work and the recent death of his daughter's father-in-law.  He is interested in checking his testosterone levels due to concerns about potential low levels, which he associates with fatigue and other symptoms. He also requests a cotinine test for work purposes.       09/30/2023    3:24 PM 01/25/2022    4:14 PM 08/08/2021    2:35 PM  Depression screen PHQ 2/9  Decreased Interest 0 0 0  Down, Depressed, Hopeless 0 0 0  PHQ - 2 Score 0 0 0  Altered sleeping 1    Tired, decreased energy 1    Change in appetite 1    Feeling bad or failure about yourself  0    Trouble concentrating 0    Moving slowly or fidgety/restless 0    Suicidal thoughts 0    PHQ-9 Score 3    Difficult doing work/chores Not difficult at all         09/30/2023    3:24 PM  GAD 7 : Generalized Anxiety Score  Nervous, Anxious, on Edge 1  Control/stop worrying 1  Worry too much - different things 1  Trouble relaxing 0  Restless 0  Easily annoyed or irritable 0  Afraid - awful might happen 0  Total GAD 7 Score 3  Anxiety Difficulty Not difficult at all    Health Maintenance Due  Topic Date Due   Hepatitis C  Screening  Never done      PMH:  The following were reviewed and entered/updated in epic: Past Medical History:  Diagnosis Date   Allergic rhinitis    Allergy    Anxiety    Arthritis    GERD (gastroesophageal reflux disease)    Hyperlipidemia    Hypertension    Nevus    NEVUS, 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   Followed as Primary Care Patient/ [facility]/ [facility]  - L thigh     Obesity    Personal history of colonic adenomas 10/20/2012   RBBB (right bundle branch block with left anterior fascicular block)     Patient Active Problem List   Diagnosis Date Noted   Bronchitis 09/02/2023   Hospital discharge follow-up 10/04/2022   Sick sinus syndrome (HCC) 10/04/2022   Symptomatic bradycardia 09/23/2022   Right-sided chest wall pain 08/21/2022   Hyperkalemia 08/21/2022   Right elbow pain 08/21/2022   Acute cough 02/08/2022   Family history of BRCA gene positive 02/08/2022   Pes planovalgus, acquired, left 08/08/2021   Tinnitus of both ears 08/08/2021   Insomnia 08/08/2021   Chronic right-sided thoracic back pain 08/08/2021   Paresthesia 07/03/2019   History of colonic polyps 10/20/2012   Screening for malignant neoplasm of prostate 09/04/2012   Hyperlipidemia 08/17/2011  Essential hypertension, benign 07/23/2011   Attention deficit disorder 09/28/2009   Rhinitis, allergic 04/23/2007    Past Surgical History:  Procedure Laterality Date   COLONOSCOPY     2014 (polyps)  and 2018 (no polyps)   LASIK     LIPOMA EXCISION     On back   Tooth extraction with bone graft  09/20/2022    Family History  Adopted: Yes  Problem Relation Age of Onset   Arthritis Mother    Throat cancer Father        smoked   BRCA 1/2 Sister    Breast cancer Sister    Colon cancer Paternal Grandmother    Pancreatic cancer Neg Hx    Rectal cancer Neg Hx    Stomach cancer Neg Hx     Medications- reviewed and updated Outpatient Medications Prior to Visit  Medication Sig Dispense Refill    ALPRAZolam (XANAX) 0.25 MG tablet Take 0.25 mg by mouth at bedtime as needed for sleep or anxiety.     Ascorbic Acid (VITAMIN C) 1000 MG tablet Take 1,000 mg by mouth daily.     [employer] MAGNESIUM GLYCINATE PO Take 240 mg by mouth in the morning and at bedtime.     ELDERBERRY PO Take by mouth daily. Elderberry Syrup     LORazepam (ATIVAN) 0.5 MG tablet Take 0.5 mg by mouth as needed for anxiety or sleep.     zolpidem (AMBIEN) 10 MG tablet Take 10 mg by mouth at bedtime as needed for sleep.     hydrochlorothiazide  (HYDRODIURIL ) 25 MG tablet Take 1 tablet (25 mg total) by mouth daily. 90 tablet 0   rosuvastatin  (CRESTOR ) 5 MG tablet TAKE ONE TABLET BY MOUTH DAILY 90 tablet 1   benzonatate  (TESSALON ) 100 MG capsule Take 1 capsule (100 mg total) by mouth 3 (three) times daily as needed for cough. 30 capsule 0   chlorpheniramine-HYDROcodone  (TUSSIONEX) 10-8 MG/5ML Take 5 mLs by mouth every 12 (twelve) hours as needed for cough. 70 mL 0   Glucosamine-Chondroitin 750-600 MG TABS Take 1 tablet by mouth daily. (Patient not taking: Reported on 09/30/2023)     cholecalciferol (VITAMIN D3) 25 MCG (1000 UNIT) tablet Take 2,000 Units by mouth daily. (Patient not taking: Reported on 09/30/2023)     predniSONE  (DELTASONE ) 20 MG tablet Take 2 tablets (40 mg total) by mouth daily with breakfast. (Patient not taking: Reported on 09/30/2023) 10 tablet 0   No facility-administered medications prior to visit.    No Known Allergies  Social History   Socioeconomic History   Marital status: Married    Spouse name: Not on file   Number of children: 2   Years of education: Not on file   Highest education level: Not on file  Occupational History   Occupation: Medical supplies salesman    Employer: [employer]  Tobacco Use   Smoking status: Never   Smokeless tobacco: Never  Vaping Use   Vaping status: Never Used  Substance and Sexual Activity   Alcohol use: Yes    Alcohol/week: 7.0 standard drinks of alcohol    Types:  7 Cans of beer per week    Comment: Daily   Drug use: No   Sexual activity: Yes  Other Topics Concern   Not on file  Social History Narrative   Not on file   Social Drivers of Health   Financial Resource Strain: Not on file  Food Insecurity: Not on file  Transportation Needs: Not on file  Physical  Activity: Not on file  Stress: Not on file  Social Connections: Not on file           Objective:  Physical Exam: BP 124/72   Pulse 63   Temp 98.1 F (36.7 C) (Temporal)   Ht 6' (1.829 m)   Wt 191 lb 3.2 oz (86.7 kg)   SpO2 99%   BMI 25.93 kg/m   Body mass index is 25.93 kg/m. Wt Readings from Last 3 Encounters:  09/30/23 191 lb 3.2 oz (86.7 kg)  09/02/23 189 lb 12.8 oz (86.1 kg)  02/08/23 185 lb (83.9 kg)    Physical Exam  GENERAL: Alert, cooperative, well developed, no acute distress HEENT: Normocephalic, normal oropharynx, moist mucous membranes CHEST: Clear to auscultation bilaterally, No wheezes, rhonchi, or crackles CARDIOVASCULAR: Normal heart rate and rhythm, S1 and S2 normal without murmurs ABDOMEN: Soft, non-tender, non-distended, without organomegaly, Normal bowel sounds EXTREMITIES: No cyanosis or edema NEUROLOGICAL: Cranial nerves grossly intact, Moves all extremities without gross motor or sensory deficit  Physical Exam      Prior labs:   No results found for this or any previous visit (from the past 2160 hours).  Lab Results  Component Value Date   CHOL 184 08/08/2022   CHOL 243 (H) 05/03/2021   CHOL 208 (H) 07/02/2019   Lab Results  Component Value Date   HDL 65.20 08/08/2022   HDL 55.70 05/03/2021   HDL 57.90 07/02/2019   Lab Results  Component Value Date   LDLCALC 94 08/08/2022   LDLCALC 160 (H) 05/03/2021   LDLCALC 126 (H) 07/02/2019   Lab Results  Component Value Date   TRIG 120.0 08/08/2022   TRIG 135.0 05/03/2021   TRIG 121.0 07/02/2019   Lab Results  Component Value Date   CHOLHDL 3 08/08/2022   CHOLHDL 4 05/03/2021    CHOLHDL 4 07/02/2019   Lab Results  Component Value Date   LDLDIRECT 163.0 01/10/2017   LDLDIRECT 138.1 09/04/2012   LDLDIRECT 120.9 08/17/2011    Last metabolic panel Lab Results  Component Value Date   GLUCOSE 124 (H) 09/24/2022   NA 135 09/24/2022   K 4.2 09/24/2022   CL 100 09/24/2022   CO2 24 09/24/2022   BUN 20 09/24/2022   CREATININE 0.99 09/24/2022   GFRNONAA >60 09/24/2022   CALCIUM  8.8 (L) 09/24/2022   PROT 7.2 08/08/2022   ALBUMIN 4.4 08/08/2022   BILITOT 0.4 08/08/2022   ALKPHOS 55 08/08/2022   AST 38 (H) 08/08/2022   ALT 43 08/08/2022   ANIONGAP 11 09/24/2022    Lab Results  Component Value Date   HGBA1C 5.7 02/14/2018    Last CBC Lab Results  Component Value Date   WBC 8.4 09/23/2022   HGB 12.8 (L) 09/23/2022   HCT 38.0 (L) 09/23/2022   MCV 87.8 09/23/2022   MCH 29.6 09/23/2022   RDW 11.9 09/23/2022   PLT 294 09/23/2022    Lab Results  Component Value Date   TSH 1.340 09/23/2022    Lab Results  Component Value Date   PSA 0.88 08/21/2022   PSA 0.79 07/02/2019   PSA 0.88 02/14/2018    Last vitamin D No results found for: Lucetta Russel, VD25OH  Lab Results  Component Value Date   BILIRUBINUR NEGATIVE 08/21/2022   UROBILINOGEN 0.2 08/21/2022   LEUKOCYTESUR NEGATIVE 08/21/2022    Lab Results  Component Value Date   MICROALBUR <0.7 08/21/2022     At today's visit, we discussed treatment options, associated risk and  benefits, and engage in counseling as needed.  Additionally the following were reviewed: Past medical records, past medical and surgical history, family and social background, as well as relevant laboratory results, imaging findings, and specialty notes, where applicable.  This message was generated using dictation software, and as a result, it may contain unintentional typos or errors.  Nevertheless, extensive effort was made to accurately convey at the pertinent aspects of the patient visit.    There  may have been are other unrelated non-urgent complaints, but due to the busy schedule and the amount of time already spent with him, time does not permit to address these issues at today's visit. Another appointment may have or has been requested to review these additional issues.     Harle Libra, MD, MS

## 2023-10-01 ENCOUNTER — Other Ambulatory Visit (INDEPENDENT_AMBULATORY_CARE_PROVIDER_SITE_OTHER)

## 2023-10-01 DIAGNOSIS — I495 Sick sinus syndrome: Secondary | ICD-10-CM | POA: Diagnosis not present

## 2023-10-01 DIAGNOSIS — R5382 Chronic fatigue, unspecified: Secondary | ICD-10-CM

## 2023-10-01 DIAGNOSIS — Z131 Encounter for screening for diabetes mellitus: Secondary | ICD-10-CM | POA: Diagnosis not present

## 2023-10-01 DIAGNOSIS — Z125 Encounter for screening for malignant neoplasm of prostate: Secondary | ICD-10-CM | POA: Diagnosis not present

## 2023-10-01 DIAGNOSIS — I10 Essential (primary) hypertension: Secondary | ICD-10-CM | POA: Diagnosis not present

## 2023-10-01 DIAGNOSIS — Z0000 Encounter for general adult medical examination without abnormal findings: Secondary | ICD-10-CM

## 2023-10-01 DIAGNOSIS — Z1159 Encounter for screening for other viral diseases: Secondary | ICD-10-CM

## 2023-10-01 DIAGNOSIS — E7800 Pure hypercholesterolemia, unspecified: Secondary | ICD-10-CM

## 2023-10-01 LAB — CBC WITH DIFFERENTIAL/PLATELET
Basophils Absolute: 0 10*3/uL (ref 0.0–0.1)
Basophils Relative: 0.2 % (ref 0.0–3.0)
Eosinophils Absolute: 0.1 10*3/uL (ref 0.0–0.7)
Eosinophils Relative: 0.7 % (ref 0.0–5.0)
HCT: 43.5 % (ref 39.0–52.0)
Hemoglobin: 14.7 g/dL (ref 13.0–17.0)
Lymphocytes Relative: 8.3 % — ABNORMAL LOW (ref 12.0–46.0)
Lymphs Abs: 0.7 10*3/uL (ref 0.7–4.0)
MCHC: 33.8 g/dL (ref 30.0–36.0)
MCV: 85.5 fl (ref 78.0–100.0)
Monocytes Absolute: 0.7 10*3/uL (ref 0.1–1.0)
Monocytes Relative: 7.7 % (ref 3.0–12.0)
Neutro Abs: 7.2 10*3/uL (ref 1.4–7.7)
Neutrophils Relative %: 83.1 % — ABNORMAL HIGH (ref 43.0–77.0)
Platelets: 325 10*3/uL (ref 150.0–400.0)
RBC: 5.08 Mil/uL (ref 4.22–5.81)
RDW: 12.6 % (ref 11.5–15.5)
WBC: 8.6 10*3/uL (ref 4.0–10.5)

## 2023-10-01 LAB — COMPREHENSIVE METABOLIC PANEL WITH GFR
ALT: 23 U/L (ref 0–53)
AST: 21 U/L (ref 0–37)
Albumin: 4.6 g/dL (ref 3.5–5.2)
Alkaline Phosphatase: 60 U/L (ref 39–117)
BUN: 12 mg/dL (ref 6–23)
CO2: 32 meq/L (ref 19–32)
Calcium: 9.6 mg/dL (ref 8.4–10.5)
Chloride: 94 meq/L — ABNORMAL LOW (ref 96–112)
Creatinine, Ser: 0.83 mg/dL (ref 0.40–1.50)
GFR: 94.53 mL/min (ref >60.00)
Glucose, Bld: 103 mg/dL — ABNORMAL HIGH (ref 70–99)
Potassium: 3.7 meq/L (ref 3.5–5.1)
Sodium: 135 meq/L (ref 135–145)
Total Bilirubin: 0.9 mg/dL (ref 0.2–1.2)
Total Protein: 7.5 g/dL (ref 6.0–8.3)

## 2023-10-01 LAB — TSH: TSH: 1.29 u[IU]/mL (ref 0.35–5.50)

## 2023-10-01 LAB — MICROALBUMIN / CREATININE URINE RATIO
Creatinine,U: 113.5 mg/dL
Microalb Creat Ratio: UNDETERMINED mg/g (ref 0.0–30.0)
Microalb, Ur: <0.7 mg/dL

## 2023-10-01 LAB — LIPID PANEL
Cholesterol: 195 mg/dL (ref 0–200)
HDL: 65.1 mg/dL (ref >39.00)
LDL Cholesterol: 98 mg/dL (ref 0–99)
NonHDL: 130.18
Total CHOL/HDL Ratio: 3
Triglycerides: 161 mg/dL — ABNORMAL HIGH (ref 0.0–149.0)
VLDL: 32.2 mg/dL (ref 0.0–40.0)

## 2023-10-01 LAB — HEMOGLOBIN A1C: Hgb A1c MFr Bld: 5.9 % (ref 4.6–6.5)

## 2023-10-01 LAB — PSA: PSA: 1.27 ng/mL (ref 0.10–4.00)

## 2023-10-01 LAB — MAGNESIUM: Magnesium: 2 mg/dL (ref 1.5–2.5)

## 2023-10-02 ENCOUNTER — Telehealth: Payer: Self-pay

## 2023-10-02 LAB — URINALYSIS W MICROSCOPIC + REFLEX CULTURE
Bacteria, UA: NONE SEEN /HPF
Bilirubin Urine: NEGATIVE
Glucose, UA: NEGATIVE
Hgb urine dipstick: NEGATIVE
Hyaline Cast: NONE SEEN /LPF
Ketones, ur: NEGATIVE
Leukocyte Esterase: NEGATIVE
Nitrites, Initial: NEGATIVE
Protein, ur: NEGATIVE
Specific Gravity, Urine: 1.017 (ref 1.001–1.035)
Squamous Epithelial / HPF: NONE SEEN /HPF (ref <=5)
WBC, UA: NONE SEEN /HPF (ref 0–5)
pH: 8 (ref 5.0–8.0)

## 2023-10-02 LAB — UNLABELED: Test Ordered On Req: 3020

## 2023-10-02 LAB — NO CULTURE INDICATED

## 2023-10-02 NOTE — Telephone Encounter (Signed)
 Copied from CRM [id number]. Topic: General - Other >> Oct 02, 2023  2:11 PM Howard Macho wrote: Reason for CRM: stephanie from [facility] called stating there was a name discrepancy on the urine sample from yesterday CB [phone] Reference [id number] D

## 2023-10-03 ENCOUNTER — Ambulatory Visit: Payer: Self-pay

## 2023-10-03 ENCOUNTER — Telehealth: Payer: Self-pay | Admitting: Family Medicine

## 2023-10-03 LAB — TESTOSTERONE,FREE AND TOTAL
Testosterone, Free: 4.7 pg/mL — ABNORMAL LOW (ref 6.6–18.1)
Testosterone: 308 ng/dL (ref 264–916)

## 2023-10-04 LAB — VITAMIN D 1,25 DIHYDROXY
Vitamin D 1, 25 (OH)2 Total: 23 pg/mL (ref 18–72)
Vitamin D2 1, 25 (OH)2: <8 pg/mL
Vitamin D3 1, 25 (OH)2: 23 pg/mL

## 2023-10-04 LAB — HEPATITIS C ANTIBODY: Hepatitis C Ab: NONREACTIVE

## 2023-10-18 IMAGING — DX DG LUMBAR SPINE COMPLETE 4+V
4 series · 4 of 4 positions shown · non-contrast
Comparison: None.

CLINICAL DATA: Chronic lower back pain, right side.

EXAM:
LUMBAR SPINE - COMPLETE 4+ VIEW

[lumbar spine ap]
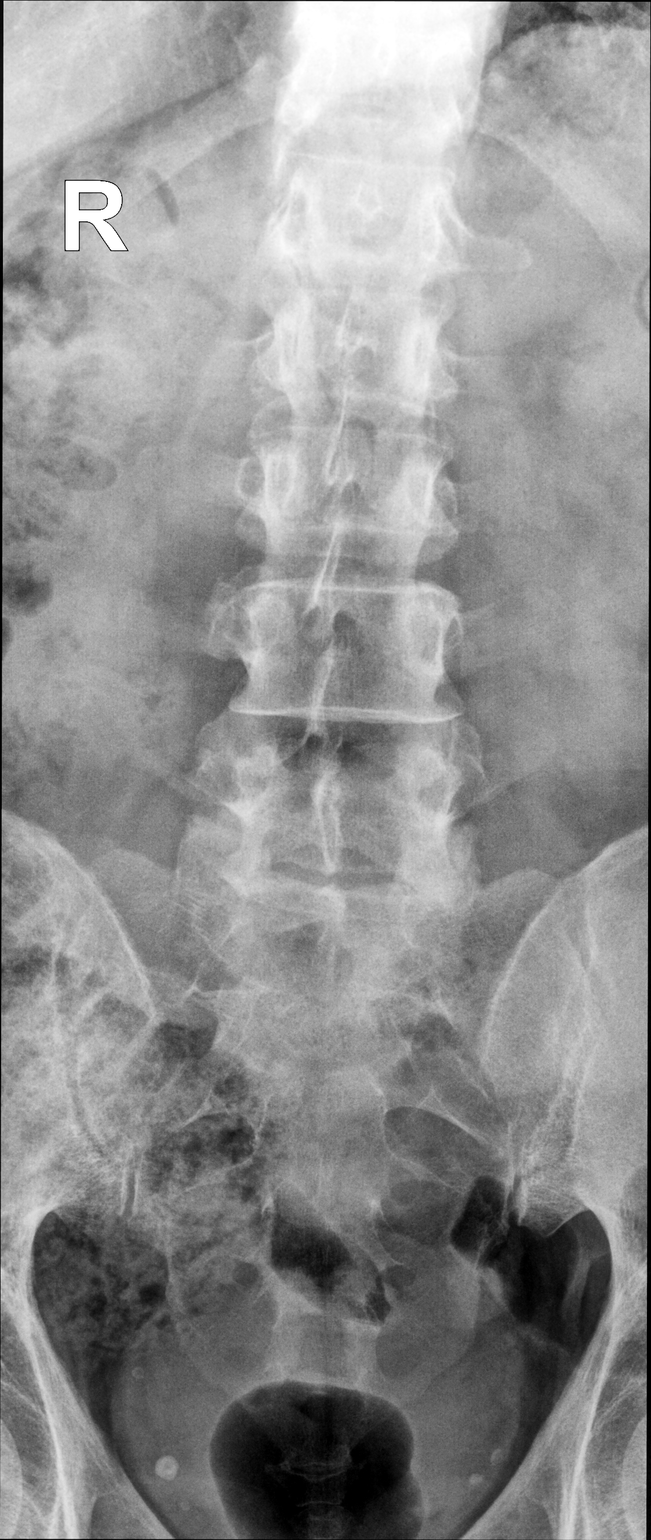

[lumbar spine lmo (1 of 2)]
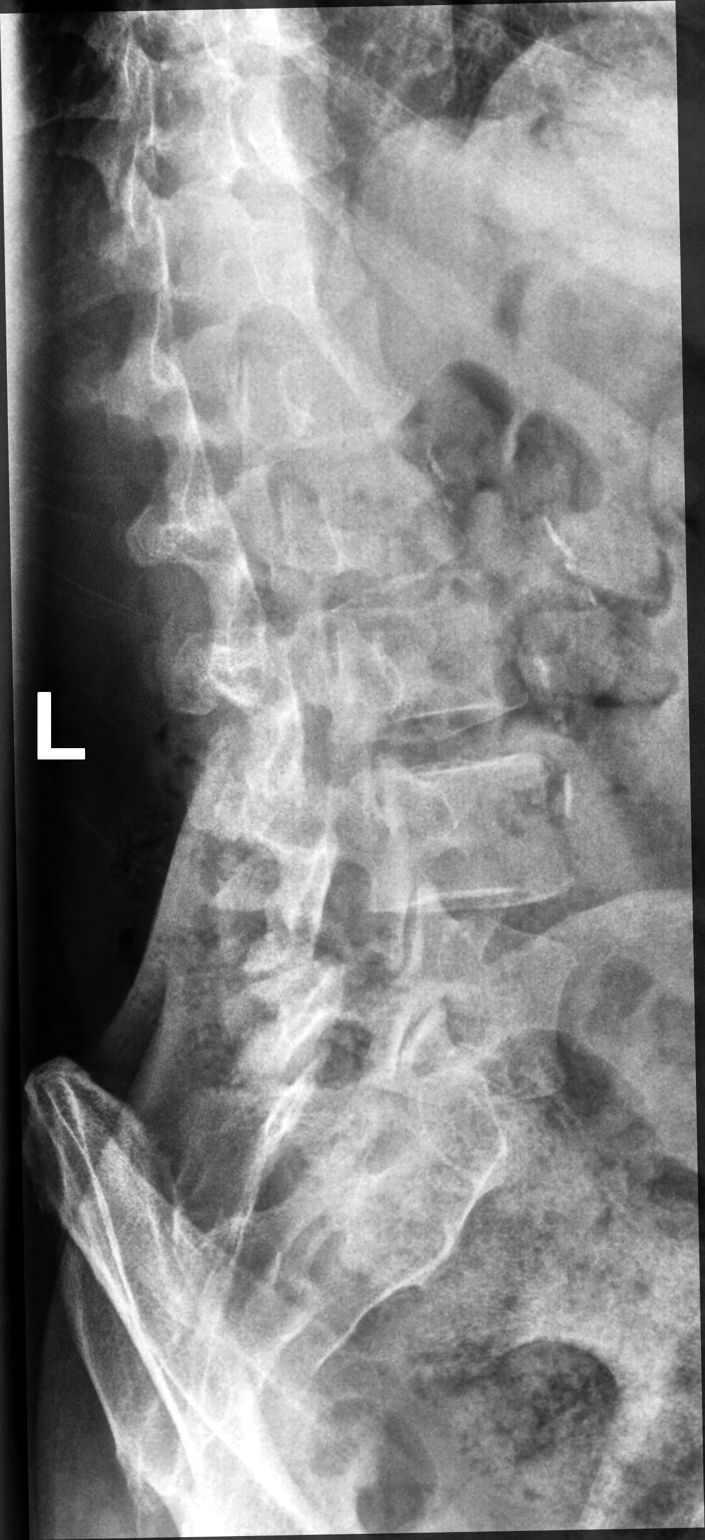

[lumbar spine lmo (2 of 2)]
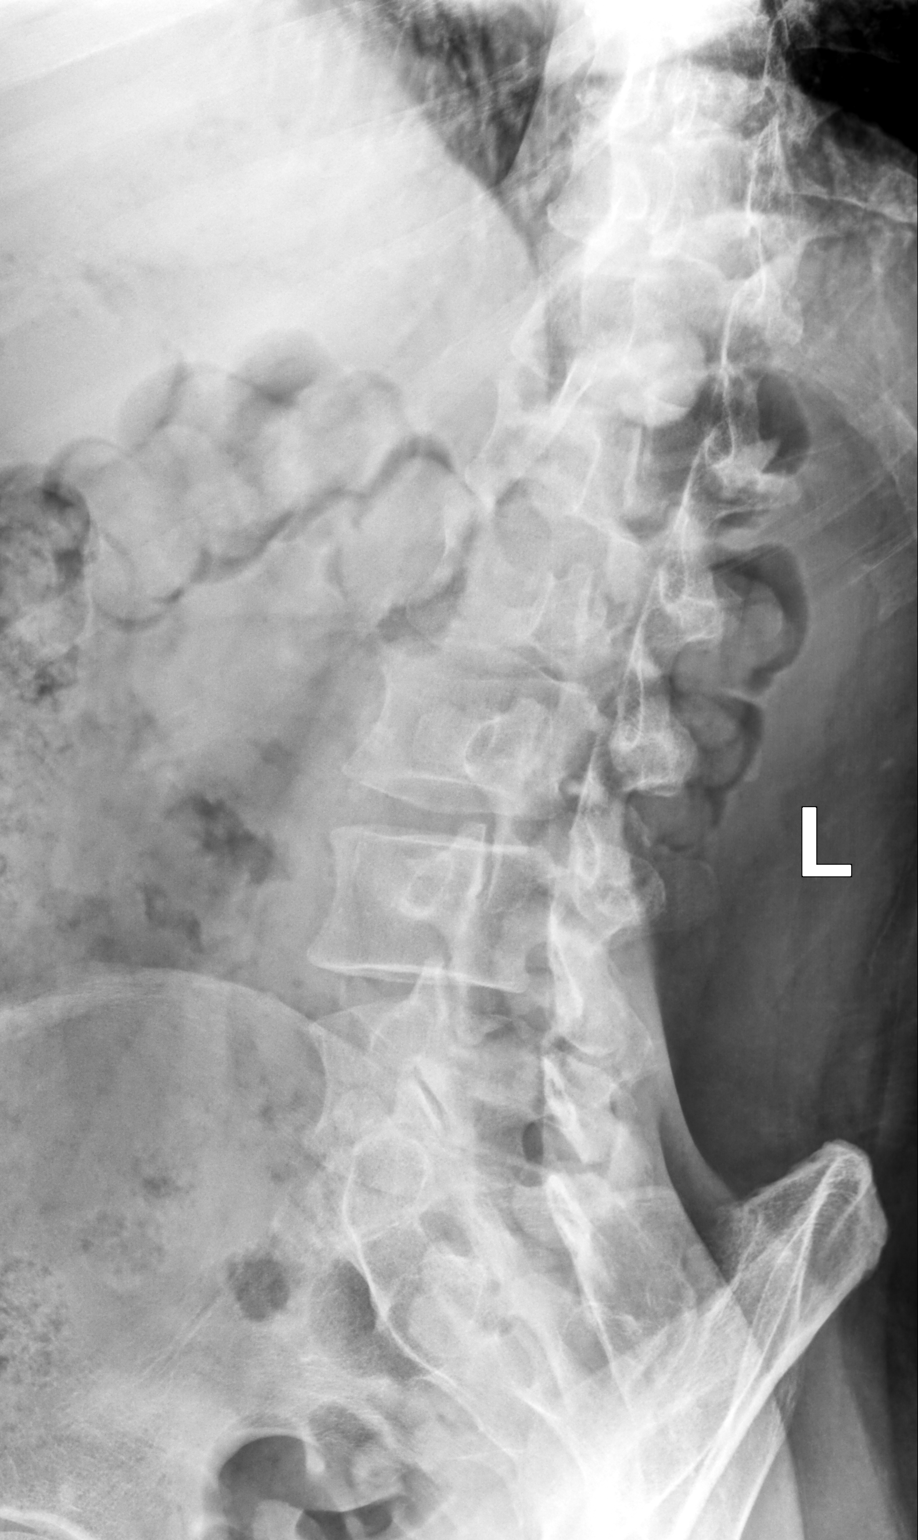

[lumbar spine lat]
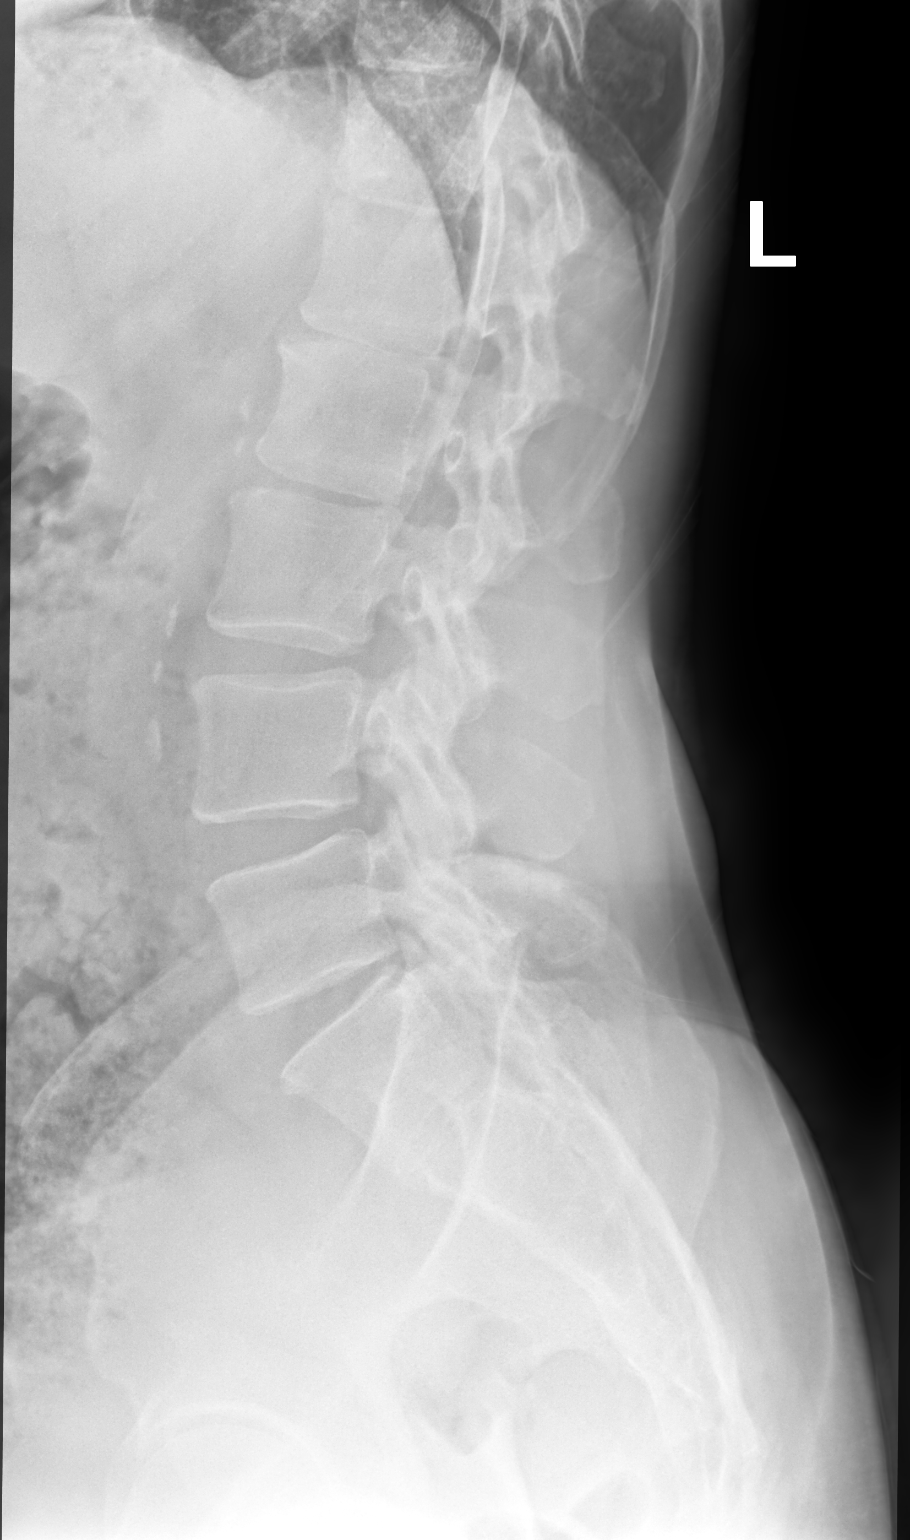

[4 of 4 positions shown; findings below may reference images not displayed]

FINDINGS: Frontal, bilateral oblique, lateral views of the lumbar spine are
obtained. There are 5 non-rib-bearing lumbar type vertebral bodies
in grossly normal alignment. No acute fractures. Mild disc space
narrowing at L5-S1. Mild facet hypertrophy at L4-5 and L5-S1.
Sacroiliac joints appear unremarkable.
IMPRESSION: 1. Mild lower lumbar spondylosis and facet hypertrophy. No acute
bony abnormality.

## 2024-02-21 ENCOUNTER — Other Ambulatory Visit: Payer: Self-pay | Admitting: Family Medicine

## 2024-02-21 DIAGNOSIS — E785 Hyperlipidemia, unspecified: Secondary | ICD-10-CM

## 2024-02-21 DIAGNOSIS — I10 Essential (primary) hypertension: Secondary | ICD-10-CM

## 2024-04-21 NOTE — Telephone Encounter (Signed)
 Error

## 2024-05-19 LAB — OPHTHALMOLOGY REPORT-SCANNED

## 2024-05-20 ENCOUNTER — Other Ambulatory Visit: Payer: Self-pay | Admitting: Family Medicine

## 2024-05-20 DIAGNOSIS — I10 Essential (primary) hypertension: Secondary | ICD-10-CM
